# Patient Record
Sex: Male | Born: 1940 | Race: White | Hispanic: No | Marital: Married | Smoking: Former smoker
Health system: Southern US, Community
[De-identification: ages and names within clinical notes are randomized; demographics above are authoritative.]

## PROBLEM LIST (undated history)

## (undated) DIAGNOSIS — M539 Dorsopathy, unspecified: Secondary | ICD-10-CM

## (undated) DIAGNOSIS — M489 Spondylopathy, unspecified: Secondary | ICD-10-CM

## (undated) DIAGNOSIS — I219 Acute myocardial infarction, unspecified: Secondary | ICD-10-CM

## (undated) DIAGNOSIS — J449 Chronic obstructive pulmonary disease, unspecified: Secondary | ICD-10-CM

## (undated) HISTORY — PX: BACK SURGERY: SHX140

---

## 1999-12-30 ENCOUNTER — Ambulatory Visit (HOSPITAL_COMMUNITY): Admission: RE | Admit: 1999-12-30 | Discharge: 1999-12-30 | Payer: Self-pay | Admitting: *Deleted

## 1999-12-30 ENCOUNTER — Encounter: Payer: Self-pay | Admitting: Rheumatology

## 2002-11-16 ENCOUNTER — Encounter: Admission: RE | Admit: 2002-11-16 | Discharge: 2003-02-14 | Payer: Self-pay | Admitting: Cardiology

## 2004-09-03 ENCOUNTER — Ambulatory Visit: Payer: Self-pay | Admitting: Cardiology

## 2004-09-06 ENCOUNTER — Ambulatory Visit: Payer: Self-pay | Admitting: Cardiology

## 2005-06-25 ENCOUNTER — Emergency Department (HOSPITAL_COMMUNITY): Admission: EM | Admit: 2005-06-25 | Discharge: 2005-06-25 | Payer: Self-pay | Admitting: Emergency Medicine

## 2005-09-12 ENCOUNTER — Ambulatory Visit: Payer: Self-pay | Admitting: Cardiology

## 2005-09-20 ENCOUNTER — Ambulatory Visit: Payer: Self-pay | Admitting: Cardiology

## 2005-10-26 ENCOUNTER — Inpatient Hospital Stay (HOSPITAL_COMMUNITY): Admission: EM | Admit: 2005-10-26 | Discharge: 2005-10-30 | Payer: Self-pay | Admitting: Emergency Medicine

## 2005-12-14 ENCOUNTER — Emergency Department (HOSPITAL_COMMUNITY): Admission: EM | Admit: 2005-12-14 | Discharge: 2005-12-14 | Payer: Self-pay | Admitting: Emergency Medicine

## 2007-03-26 ENCOUNTER — Ambulatory Visit: Payer: Self-pay | Admitting: Cardiology

## 2007-03-26 LAB — CONVERTED CEMR LAB
Albumin: 3.9 g/dL (ref 3.5–5.2)
BUN: 10 mg/dL (ref 6–23)
Calcium: 9.6 mg/dL (ref 8.4–10.5)
Chloride: 101 meq/L (ref 96–112)
Potassium: 4.1 meq/L (ref 3.5–5.1)
Sodium: 143 meq/L (ref 135–145)
Total Bilirubin: 0.6 mg/dL (ref 0.3–1.2)
VLDL: 61 mg/dL — ABNORMAL HIGH (ref 0–40)

## 2007-06-05 IMAGING — CT CT HEAD W/O CM
1 series · 16 of 30 positions shown, 20 images · IV contrast (agent unspecified)
Comparison: None

CLINICAL DATA: Dizziness and vomiting.
TECHNIQUE: 5mm collimated images were obtained from the base of the skull
through the vertex according to standard protocol without contrast.

HEAD CT WITHOUT CONTRAST:

[Series 7949: — · axial · 0.49mm/px · z∈[-607,-467]mm · 16 of 32 slices shown, 20 images]
[im 2/32  brain]
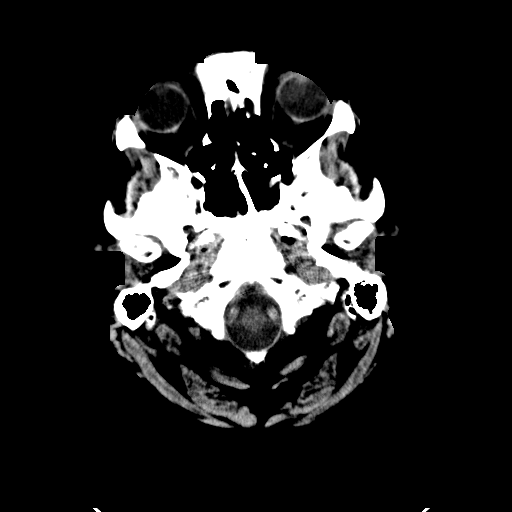
[im 2/32  bone]
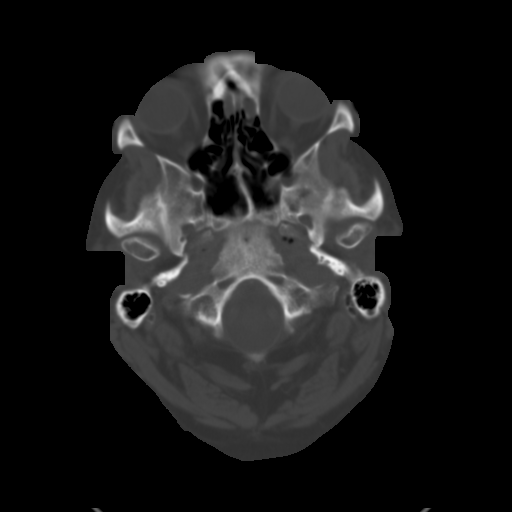
[im 4/32  brain]
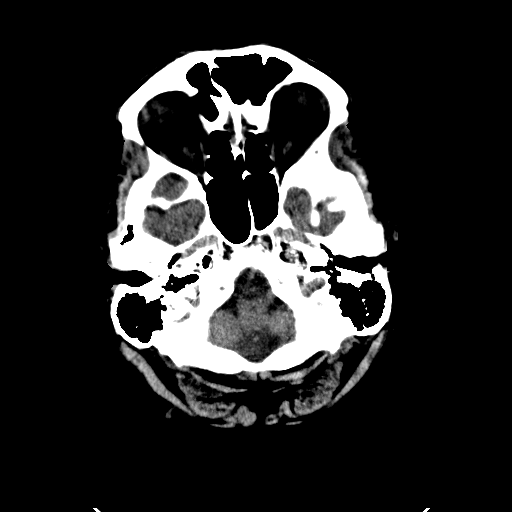
[im 6/32  brain]
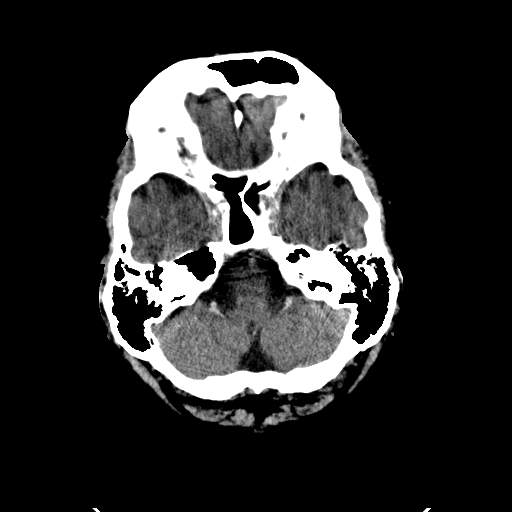
[im 8/32  brain]
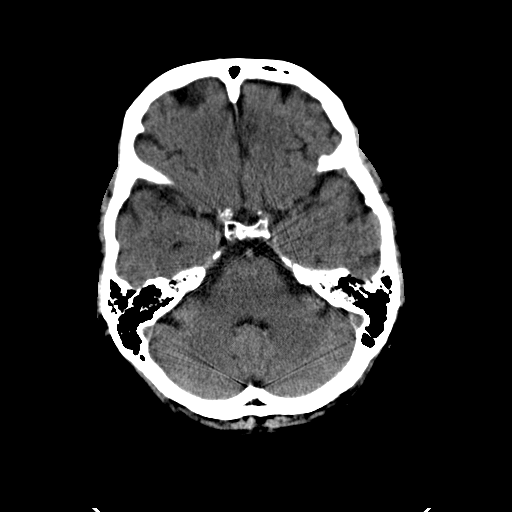
[im 9/32  brain]
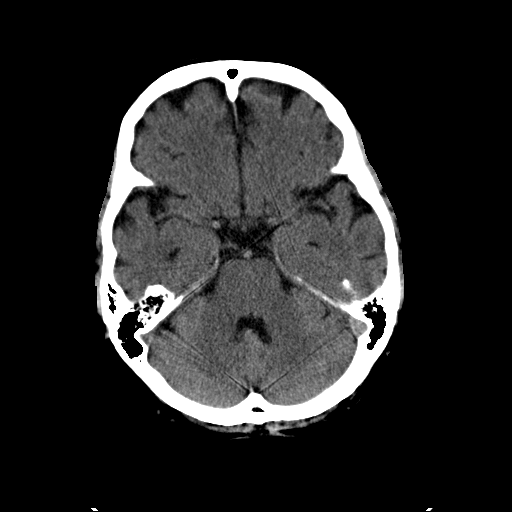
[im 9/32  bone]
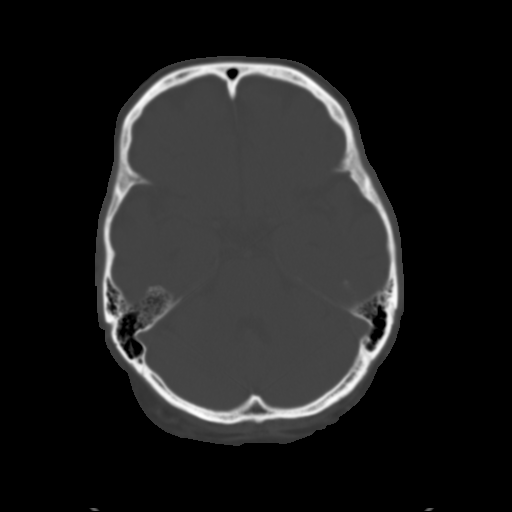
[im 11/32  brain]
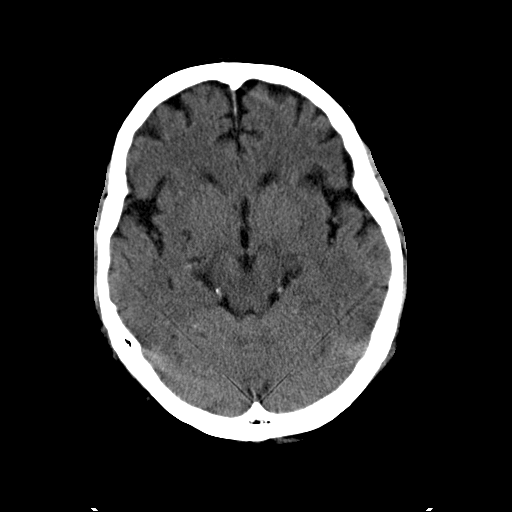
[im 13/32  brain]
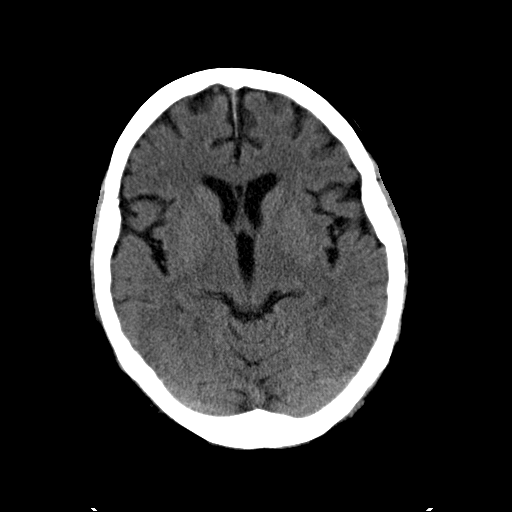
[im 15/32  brain]
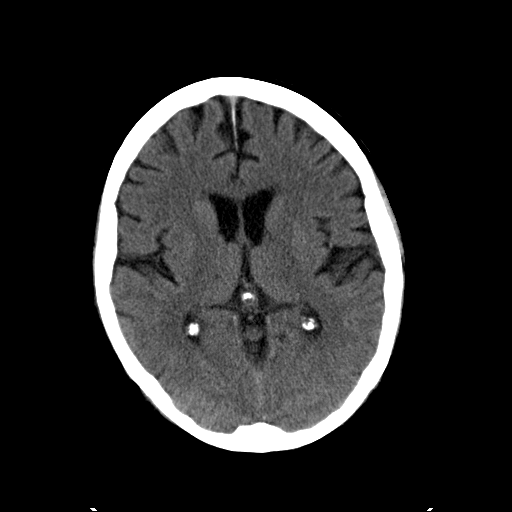
[im 17/32  brain]
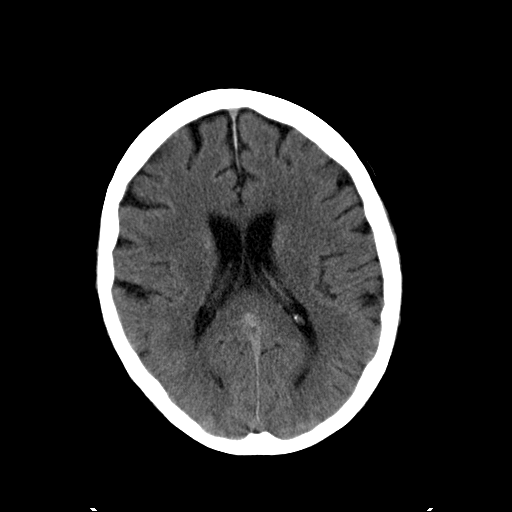
[im 17/32  bone]
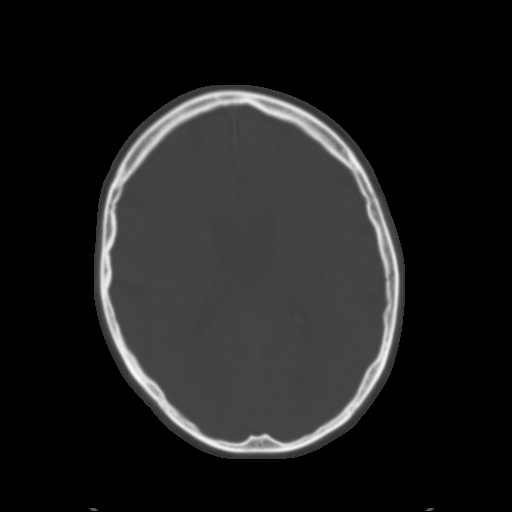
[im 19/32  brain]
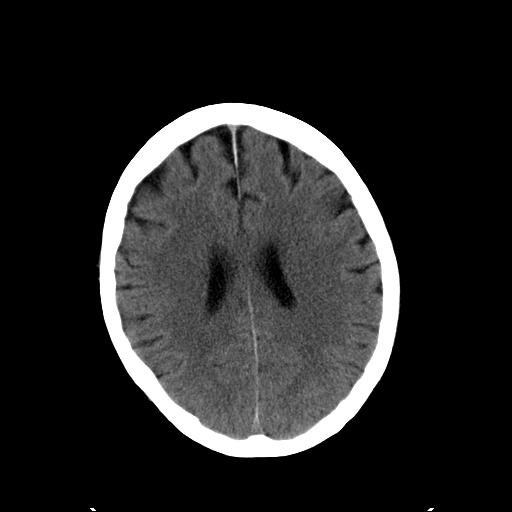
[im 21/32  brain]
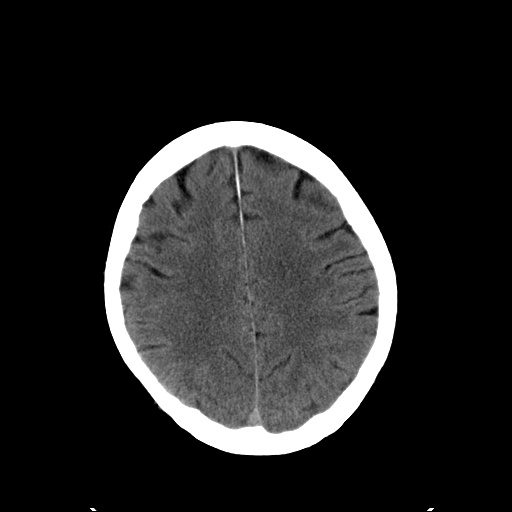
[im 23/32  brain]
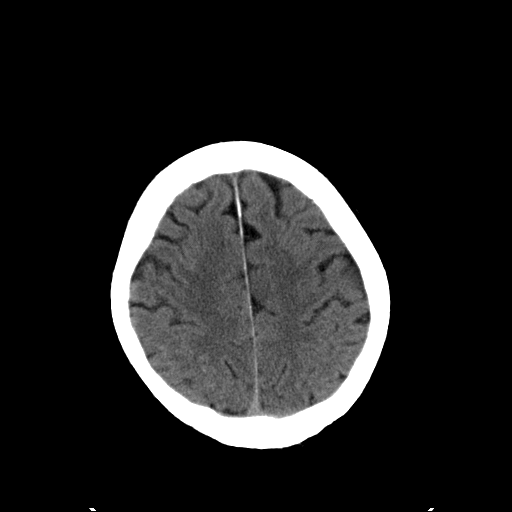
[im 24/32  brain]
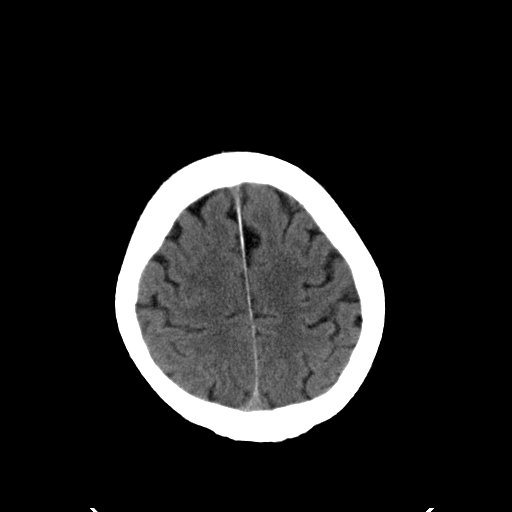
[im 24/32  bone]
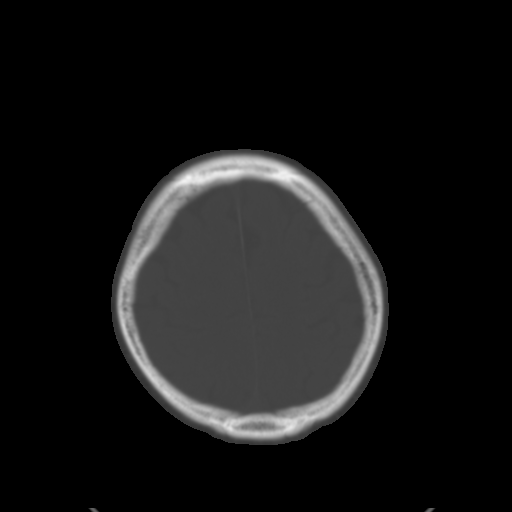
[im 26/32  brain]
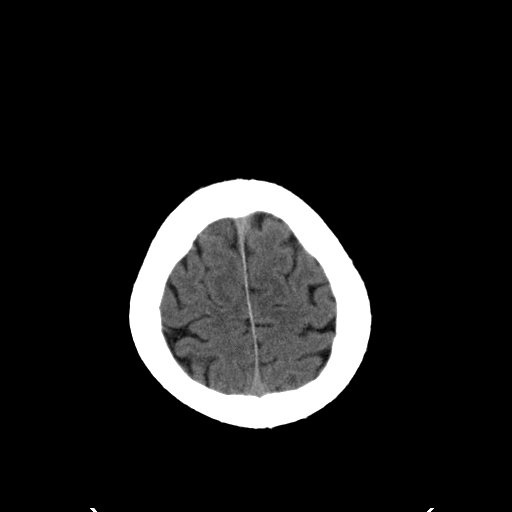
[im 28/32  brain]
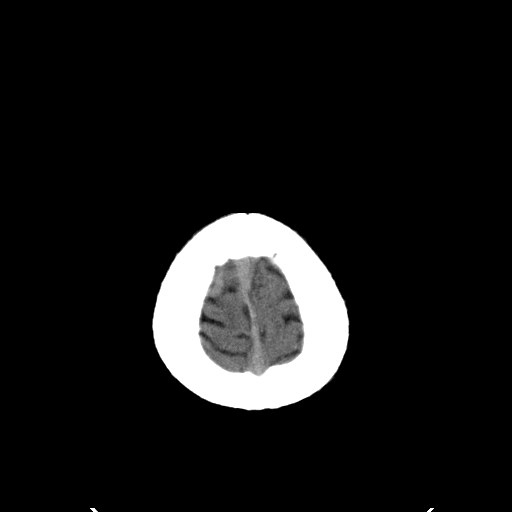
[im 30/32  brain]
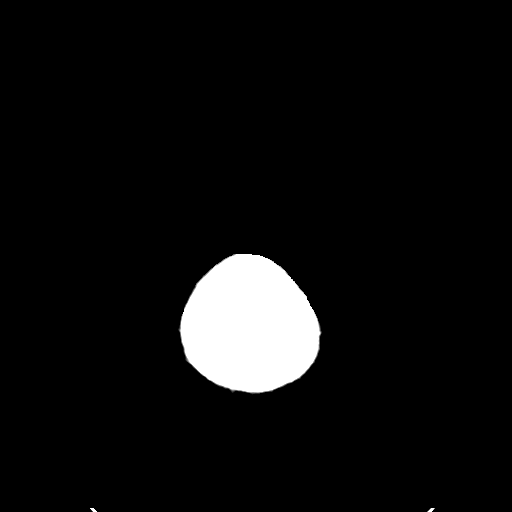

[16 of 30 positions shown; findings below may reference images not displayed]

FINDINGS: There is no evidence for acute hemorrhage, hydrocephalus, mass-effect
or abnormal extra-axial fluid collection.  No definite CT evidence for acute
ischemia. Patchy low attenuation in the deep hemispheric white matter is
nonspecific, but likely related to chronic small vessel disease. Lacunar
infarction noted in the right basal ganglia and right [place]. Mucosal
thickening is noted in the paranasal sinuses.
IMPRESSION: No acute intracranial abnormality. 

Areas of acute ischemia may not be apparent for up to 48 hours on CT head.

## 2008-03-11 ENCOUNTER — Ambulatory Visit: Payer: Self-pay | Admitting: Cardiology

## 2008-11-07 DIAGNOSIS — I219 Acute myocardial infarction, unspecified: Secondary | ICD-10-CM | POA: Insufficient documentation

## 2008-11-07 DIAGNOSIS — J449 Chronic obstructive pulmonary disease, unspecified: Secondary | ICD-10-CM

## 2008-11-07 DIAGNOSIS — E669 Obesity, unspecified: Secondary | ICD-10-CM

## 2008-11-07 DIAGNOSIS — E785 Hyperlipidemia, unspecified: Secondary | ICD-10-CM | POA: Insufficient documentation

## 2008-11-07 DIAGNOSIS — E119 Type 2 diabetes mellitus without complications: Secondary | ICD-10-CM

## 2008-11-07 DIAGNOSIS — R0902 Hypoxemia: Secondary | ICD-10-CM | POA: Insufficient documentation

## 2008-11-07 DIAGNOSIS — J4489 Other specified chronic obstructive pulmonary disease: Secondary | ICD-10-CM | POA: Insufficient documentation

## 2008-11-07 DIAGNOSIS — I10 Essential (primary) hypertension: Secondary | ICD-10-CM | POA: Insufficient documentation

## 2008-11-07 DIAGNOSIS — G4730 Sleep apnea, unspecified: Secondary | ICD-10-CM | POA: Insufficient documentation

## 2008-11-07 DIAGNOSIS — I2510 Atherosclerotic heart disease of native coronary artery without angina pectoris: Secondary | ICD-10-CM

## 2008-11-10 ENCOUNTER — Ambulatory Visit: Payer: Self-pay | Admitting: Cardiology

## 2008-11-14 ENCOUNTER — Telehealth: Payer: Self-pay | Admitting: Cardiology

## 2008-11-29 LAB — CONVERTED CEMR LAB
AST: 20 units/L (ref 0–37)
Albumin: 4.3 g/dL (ref 3.5–5.2)
BUN: 11 mg/dL (ref 6–23)
Basophils Absolute: 0 10*3/uL (ref 0.0–0.1)
Basophils Relative: 0.3 % (ref 0.0–3.0)
Bilirubin, Direct: 0.1 mg/dL (ref 0.0–0.3)
Eosinophils Absolute: 0.6 10*3/uL (ref 0.0–0.7)
GFR calc non Af Amer: 88.79 mL/min (ref >60)
Hemoglobin: 16.6 g/dL (ref 13.0–17.0)
MCHC: 34.2 g/dL (ref 30.0–36.0)
MCV: 94 fL (ref 78.0–100.0)
Monocytes Relative: 5.6 % (ref 3.0–12.0)
Neutrophils Relative %: 68.9 % (ref 43.0–77.0)
Platelets: 204 10*3/uL (ref 150.0–400.0)
RBC: 5.16 M/uL (ref 4.22–5.81)
RDW: 13.5 % (ref 11.5–14.6)
Sodium: 143 meq/L (ref 135–145)
Total Bilirubin: 0.7 mg/dL (ref 0.3–1.2)
Total CHOL/HDL Ratio: 4
WBC: 9.8 10*3/uL (ref 4.5–10.5)

## 2009-03-27 ENCOUNTER — Telehealth: Payer: Self-pay | Admitting: Cardiology

## 2009-06-20 ENCOUNTER — Encounter: Admission: RE | Admit: 2009-06-20 | Discharge: 2009-06-20 | Payer: Self-pay | Admitting: Rheumatology

## 2009-10-27 ENCOUNTER — Ambulatory Visit: Payer: Self-pay | Admitting: Cardiology

## 2009-10-31 LAB — CONVERTED CEMR LAB
ALT: 17 units/L (ref 0–53)
AST: 17 units/L (ref 0–37)
Albumin: 4.3 g/dL (ref 3.5–5.2)
Alkaline Phosphatase: 53 units/L (ref 39–117)
Bilirubin, Direct: 0.1 mg/dL (ref 0.0–0.3)
Creatinine, Ser: 0.7 mg/dL (ref 0.4–1.5)
GFR calc non Af Amer: 110.98 mL/min (ref >60)
Glucose, Bld: 81 mg/dL (ref 70–99)
Total Protein: 6.9 g/dL (ref 6.0–8.3)
VLDL: 71.6 mg/dL — ABNORMAL HIGH (ref 0.0–40.0)

## 2010-06-26 NOTE — Assessment & Plan Note (Signed)
Summary: [name]   Visit Type:  1 yr f/u Primary Provider:  Dr. Megan Mans  CC:  chest pain...sob....edema/ankles.  History of Present Illness: [name] returns today for evaluation and management of his multiple cardiac and other medical issues. Please see assessment and plan.  He is pretty much confined to wheelchair and 2 crutches with his bad orthopedic issues. He denies any angina or ischemic symptoms. He's had no palpitations or syncope. He does have dyspnea on exertion which is pretty much stable.  He still smokes. He does not check his blood sugars. Fortunately, his hemoglobin A1c was only 6.9% last year. He refuses to go to primary care so I manage his diabetes.    Current Medications (verified): 1)  Simvastatin 40 Mg Tabs (Simvastatin) .Marland Kitchen.. 1 Tab Once Daily 2)  Sertraline Hcl 100 Mg Tabs (Sertraline Hcl) .Marland Kitchen.. 1 Tab Once Daily 3)  Benazepril Hcl 10 Mg Tabs (Benazepril Hcl) .Marland Kitchen.. 1 Tab Once Daily 4)  Gabapentin 100 Mg Caps (Gabapentin) .Marland Kitchen.. 1 Tab Two Times A Day 5)  Alprazolam 0.5 Mg Tabs (Alprazolam) .Marland Kitchen.. 1 Tab Two Times A Day 6)  Metformin Hcl 1000 Mg Tabs (Metformin Hcl) .Marland Kitchen.. 1 Tab Two Times A Day 7)  Oxycodone Hcl 5 Mg Tabs (Oxycodone Hcl) .Marland Kitchen.. 1-3 Tabs Daily 8)  Tramadol Hcl 50 Mg Tabs (Tramadol Hcl) .... 2 Tabs Two Times A Day 9)  Metoprolol Tartrate 25 Mg Tabs (Metoprolol Tartrate) .Marland Kitchen.. 1 Tab Two Times A Day 10)  Niaspan 500 Mg Cr-Tabs (Niacin (Antihyperlipidemic)) .... Take 2 Tablet By Mouth At Bedtime  Allergies: 1)  ! Codeine  Past History:  Past Medical History: Last updated: 11/07/2008 MI (ICD-410.90) CAD, UNSPECIFIED SITE (ICD-414.00) HYPERTENSION, UNSPECIFIED (ICD-401.9) HYPERLIPIDEMIA-MIXED (ICD-272.4) COPD (ICD-496) DIABETES MELLITUS, TYPE II (ICD-250.00) OBESITY (ICD-278.00) HYPOXEMIA (ICD-799.02) SLEEP APNEA (ICD-780.57)  Past Surgical History: Last updated: 11/07/2008 11 years ago when he had an angioplasty, but at that time a stent was not  placed.  Family History: Last updated: 11/07/2008  Positive for COPD  Social History: Last updated: 11/07/2008 Disabled  Tobacco Use - Yes.   Risk Factors: Smoking Status: current (11/07/2008)  Review of Systems       negative other than history of present illness  Vital Signs:  Patient profile:   70 year old male Height:      70 inches Weight:      205 pounds BMI:     29.52 Pulse rate:   75 / minute Pulse rhythm:   irregular BP sitting:   124 / 70  (left arm) Cuff size:   large  Vitals Entered By: Danielle Rankin, CMA (October 27, 2009 11:46 AM)  Physical Exam  General:  chronically ill, older appearing than stated age., overweight, muscular Head:  normocephalic and atraumatic Eyes:  PERRLA/EOM intact; conjunctiva and lids normal. Neck:  Neck supple, no JVD. No masses, thyromegaly or abnormal cervical nodes. Chest [place]:  no deformities or breast masses noted Lungs:  decreased breath sounds throughout Heart:  difficult to appreciate PMI, soft S1-S2 splits Msk:  decreased ROM.  in wheelchairdecreased ROM.   Pulses:  pulses normal in all 4 extremities Extremities:  ddent rubor, decreased capillary reflux Neurologic:  Alert and oriented x 3. Skin:  Intact without lesions or rashes. Psych:  Normal affect.   EKG  Procedure date:  10/27/2009  Findings:      normal sinus rhythm, right axis deviation, anterior [place] infarct pattern, no change  Impression & Recommendations:  Problem # 1:  CAD, UNSPECIFIED SITE (ICD-414.00) Assessment Unchanged I feel he is stable. Continue medical therapy. His updated medication list for this problem includes:    Benazepril Hcl 10 Mg Tabs (Benazepril hcl) .Marland Kitchen... 1 tab once daily    Metoprolol Tartrate 25 Mg Tabs (Metoprolol tartrate) .Marland Kitchen... 1 tab two times a day  His updated medication list for this problem includes:    Benazepril Hcl 10 Mg Tabs (Benazepril hcl) .Marland Kitchen... 1 tab once daily    Metoprolol Tartrate 25 Mg Tabs (Metoprolol  tartrate) .Marland Kitchen... 1 tab two times a day  Orders: EKG w/ Interpretation (93000)  Problem # 2:  HYPERTENSION, UNSPECIFIED (ICD-401.9) Assessment: Improved  His updated medication list for this problem includes:    Benazepril Hcl 10 Mg Tabs (Benazepril hcl) .Marland Kitchen... 1 tab once daily    Metoprolol Tartrate 25 Mg Tabs (Metoprolol tartrate) .Marland Kitchen... 1 tab two times a day  His updated medication list for this problem includes:    Benazepril Hcl 10 Mg Tabs (Benazepril hcl) .Marland Kitchen... 1 tab once daily    Metoprolol Tartrate 25 Mg Tabs (Metoprolol tartrate) .Marland Kitchen... 1 tab two times a day  Problem # 3:  HYPERLIPIDEMIA-MIXED (ICD-272.4) I will check lipids today as well as LFTs. No change in treatment. The following medications were removed from the medication list:    Zocor 20 Mg Tabs (Simvastatin) .Marland Kitchen... Take 1 tablet by mouth at bedtime His updated medication list for this problem includes:    Simvastatin 40 Mg Tabs (Simvastatin) .Marland Kitchen... 1 tab once daily    Niaspan 500 Mg Cr-tabs (Niacin (antihyperlipidemic)) .Marland Kitchen... Take 2 tablet by mouth at bedtime  The following medications were removed from the medication list:    Zocor 20 Mg Tabs (Simvastatin) .Marland Kitchen... Take 1 tablet by mouth at bedtime His updated medication list for this problem includes:    Simvastatin 40 Mg Tabs (Simvastatin) .Marland Kitchen... 1 tab once daily    Niaspan 500 Mg Cr-tabs (Niacin (antihyperlipidemic)) .Marland Kitchen... Take 2 tablet by mouth at bedtime  Orders: TLB-Lipid Panel (80061-LIPID)  Problem # 4:  COPD (ICD-496) Advised to quit smoking.  Problem # 5:  MI (ICD-410.90) Assessment: Unchanged  His updated medication list for this problem includes:    Benazepril Hcl 10 Mg Tabs (Benazepril hcl) .Marland Kitchen... 1 tab once daily    Metoprolol Tartrate 25 Mg Tabs (Metoprolol tartrate) .Marland Kitchen... 1 tab two times a day  His updated medication list for this problem includes:    Benazepril Hcl 10 Mg Tabs (Benazepril hcl) .Marland Kitchen... 1 tab once daily    Metoprolol Tartrate 25 Mg  Tabs (Metoprolol tartrate) .Marland Kitchen... 1 tab two times a day  Problem # 6:  DIABETES MELLITUS, TYPE II (ICD-250.00) I have advised him to check his blood sugar more frequently at time. Check hemoglobin A1c today. His updated medication list for this problem includes:    Benazepril Hcl 10 Mg Tabs (Benazepril hcl) .Marland Kitchen... 1 tab once daily    Metformin Hcl 1000 Mg Tabs (Metformin hcl) .Marland Kitchen... 1 tab two times a day  His updated medication list for this problem includes:    Benazepril Hcl 10 Mg Tabs (Benazepril hcl) .Marland Kitchen... 1 tab once daily    Metformin Hcl 1000 Mg Tabs (Metformin hcl) .Marland Kitchen... 1 tab two times a day  Orders: TLB-BMP (Basic Metabolic Panel-BMET) (80048-METABOL) TLB-A1C / Hgb A1C (Glycohemoglobin) (83036-A1C)  Problem # 7:  SLEEP APNEA (ICD-780.57) Assessment: Unchanged  Other Orders: TLB-Hepatic/Liver Function Pnl (80076-HEPATIC)  Patient Instructions: 1)  Your physician recommends that you schedule a follow-up  appointment in: YEAR WITH DR Braniya Farrugia 2)  Your physician recommends that you continue on your current medications as directed. Please refer to the Current Medication list given to you today. 3)  Your physician recommends that you return for lab work ZO:XWRUE BMET LIPID LIVER A1C

## 2010-07-24 ENCOUNTER — Encounter: Payer: Self-pay | Admitting: Cardiology

## 2010-07-24 ENCOUNTER — Ambulatory Visit (INDEPENDENT_AMBULATORY_CARE_PROVIDER_SITE_OTHER): Payer: Medicare Other | Admitting: Cardiology

## 2010-07-24 ENCOUNTER — Other Ambulatory Visit: Payer: Self-pay | Admitting: Cardiology

## 2010-07-24 DIAGNOSIS — F17200 Nicotine dependence, unspecified, uncomplicated: Secondary | ICD-10-CM | POA: Insufficient documentation

## 2010-07-24 DIAGNOSIS — E785 Hyperlipidemia, unspecified: Secondary | ICD-10-CM

## 2010-07-24 DIAGNOSIS — I219 Acute myocardial infarction, unspecified: Secondary | ICD-10-CM

## 2010-07-24 DIAGNOSIS — E782 Mixed hyperlipidemia: Secondary | ICD-10-CM

## 2010-07-24 DIAGNOSIS — I10 Essential (primary) hypertension: Secondary | ICD-10-CM

## 2010-07-24 DIAGNOSIS — E119 Type 2 diabetes mellitus without complications: Secondary | ICD-10-CM

## 2010-07-24 DIAGNOSIS — I2510 Atherosclerotic heart disease of native coronary artery without angina pectoris: Secondary | ICD-10-CM

## 2010-07-24 LAB — BASIC METABOLIC PANEL
Calcium: 8.7 mg/dL (ref 8.4–10.5)
Chloride: 98 mEq/L (ref 96–112)
GFR: 135.83 mL/min (ref >60.00)
Glucose, Bld: 114 mg/dL — ABNORMAL HIGH (ref 70–99)
Potassium: 4.7 mEq/L (ref 3.5–5.1)
Sodium: 139 mEq/L (ref 135–145)

## 2010-07-24 LAB — HEMOGLOBIN A1C: Hgb A1c MFr Bld: 7.2 % — ABNORMAL HIGH (ref 4.6–6.5)

## 2010-07-24 LAB — HEPATIC FUNCTION PANEL
ALT: 15 U/L (ref 0–53)
Albumin: 3.9 g/dL (ref 3.5–5.2)
Bilirubin, Direct: 0.1 mg/dL (ref 0.0–0.3)
Total Bilirubin: 0.5 mg/dL (ref 0.3–1.2)

## 2010-07-24 LAB — LIPID PANEL
Cholesterol: 97 mg/dL (ref 0–200)
Total CHOL/HDL Ratio: 4
VLDL: 58.2 mg/dL — ABNORMAL HIGH (ref 0.0–40.0)

## 2010-08-02 NOTE — Assessment & Plan Note (Signed)
Summary: per check out/sf per pt -mj/ep-   Visit Type:  6 mo follow up Primary Provider:  Dr. Megan Mans  CC:  edema at times. pt has no other complaints today. pt did not have med list today....verified verbally.Marland Kitchen  History of Present Illness: Mr Gutridge returns for E and M of his CAD, HTN, DM, and hyperlipidemia. He is confined to wheelchair. Still smokes a pack a day. Wants bloodworlk today. Refuses to go to primary care. No cardiac complaints.  Current Medications (verified): 1)  Simvastatin 40 Mg Tabs (Simvastatin) .Marland Kitchen.. 1 Tab Once Daily 2)  Sertraline Hcl 100 Mg Tabs (Sertraline Hcl) .Marland Kitchen.. 1 Tab Once Daily 3)  Benazepril Hcl 10 Mg Tabs (Benazepril Hcl) .Marland Kitchen.. 1 Tab Once Daily 4)  Alprazolam 0.5 Mg Tabs (Alprazolam) .Marland Kitchen.. 1 Tab Two Times A Day 5)  Metformin Hcl 1000 Mg Tabs (Metformin Hcl) .Marland Kitchen.. 1 Tab Two Times A Day 6)  Oxycodone-Acetaminophen 7.5-500 Mg Tabs (Oxycodone-Acetaminophen) .Marland Kitchen.. 1-3 Tabs Daily or As Needed 7)  Tramadol Hcl 50 Mg Tabs (Tramadol Hcl) .... 2 Tabs Two Times A Day 8)  Metoprolol Tartrate 25 Mg Tabs (Metoprolol Tartrate) .Marland Kitchen.. 1 Tab Two Times A Day 9)  Niaspan 500 Mg Cr-Tabs (Niacin (Antihyperlipidemic)) .... Take 2 Tablet By Mouth At Bedtime  Allergies (verified): 1)  ! Codeine  Past History:  Past Medical History: Last updated: 11/07/2008 MI (ICD-410.90) CAD, UNSPECIFIED SITE (ICD-414.00) HYPERTENSION, UNSPECIFIED (ICD-401.9) HYPERLIPIDEMIA-MIXED (ICD-272.4) COPD (ICD-496) DIABETES MELLITUS, TYPE II (ICD-250.00) OBESITY (ICD-278.00) HYPOXEMIA (ICD-799.02) SLEEP APNEA (ICD-780.57)  Past Surgical History: Last updated: 11/07/2008 11 years ago when he had an angioplasty, but at that time a stent was not placed.  Family History: Last updated: 11/07/2008  Positive for COPD  Social History: Last updated: 11/07/2008 Disabled  Tobacco Use - Yes.   Risk Factors: Smoking Status: current (11/07/2008)  Review of Systems       negative other than  HPI  Vital Signs:  Patient profile:   70 year old male Height:      70 inches Weight:      205 pounds BMI:     29.52 Pulse rate:   76 / minute Resp:     18 per minute BP sitting:   112 / 72  (left arm) Cuff size:   large  Vitals Entered By: Celestia Khat, CMA (July 24, 2010 10:42 AM)  Physical Exam  General:  Chronically ill, NAD Head:  normocephalic and atraumatic Eyes:  PERRLA/EOM intact; conjunctiva and lids normal. Neck:  Neck supple, no JVD. No masses, thyromegaly or abnormal cervical nodes. Lungs:  DECREASES BS THROUGHOUT Heart:  RRR, SOFT S1 AND S2, NO BRUITS Msk:  decreased ROM.  IN WHEELCHAIR Pulses:  REDUCED BUT PRESENT IN LE Extremities:  trace left pedal edema and trace right pedal edema. DEPENDENT RUBOR, REDUCED CAPILLARY REFILL  Neurologic:  Alert and oriented x 3. Skin:  Intact without lesions or rashes. Psych:  Normal affect.   EKG  Procedure date:  07/24/2010  Findings:      NSR, OLD IMI, NO CHANGES  Impression & Recommendations:  Problem # 1:  CAD, UNSPECIFIED SITE (ICD-414.00) Assessment Unchanged  His updated medication list for this problem includes:    Benazepril Hcl 10 Mg Tabs (Benazepril hcl) .Marland Kitchen... 1 tab once daily    Metoprolol Tartrate 25 Mg Tabs (Metoprolol tartrate) .Marland Kitchen... 1 tab two times a day  Orders: EKG w/ Interpretation (93000) TLB-BMP (Basic Metabolic Panel-BMET) (80048-METABOL) TLB-Hepatic/Liver Function Pnl (80076-HEPATIC) TLB-Lipid Panel (80061-LIPID)  Problem #  2:  MI (ICD-410.90) Assessment: Unchanged  His updated medication list for this problem includes:    Benazepril Hcl 10 Mg Tabs (Benazepril hcl) .Marland Kitchen... 1 tab once daily    Metoprolol Tartrate 25 Mg Tabs (Metoprolol tartrate) .Marland Kitchen... 1 tab two times a day  Orders: EKG w/ Interpretation (93000) TLB-BMP (Basic Metabolic Panel-BMET) (80048-METABOL) TLB-Hepatic/Liver Function Pnl (80076-HEPATIC) TLB-Lipid Panel (80061-LIPID)  Problem # 3:  HYPERTENSION,  UNSPECIFIED (ICD-401.9) Assessment: Improved  His updated medication list for this problem includes:    Benazepril Hcl 10 Mg Tabs (Benazepril hcl) .Marland Kitchen... 1 tab once daily    Metoprolol Tartrate 25 Mg Tabs (Metoprolol tartrate) .Marland Kitchen... 1 tab two times a day  Orders: TLB-BMP (Basic Metabolic Panel-BMET) (80048-METABOL) TLB-Hepatic/Liver Function Pnl (80076-HEPATIC) TLB-Lipid Panel (80061-LIPID)  Problem # 4:  HYPERLIPIDEMIA-MIXED (ICD-272.4)  His updated medication list for this problem includes:    Simvastatin 40 Mg Tabs (Simvastatin) .Marland Kitchen... 1 tab once daily    Niaspan 500 Mg Cr-tabs (Niacin (antihyperlipidemic)) .Marland Kitchen... Take 2 tablet by mouth at bedtime  Orders: TLB-Hepatic/Liver Function Pnl (80076-HEPATIC) TLB-Lipid Panel (80061-LIPID)  Problem # 5:  COPD (ICD-496) Assessment: Deteriorated  Problem # 6:  DIABETES MELLITUS, TYPE II (ICD-250.00) Assessment: Unchanged  His updated medication list for this problem includes:    Benazepril Hcl 10 Mg Tabs (Benazepril hcl) .Marland Kitchen... 1 tab once daily    Metformin Hcl 1000 Mg Tabs (Metformin hcl) .Marland Kitchen... 1 tab two times a day  Orders: TLB-A1C / Hgb A1C (Glycohemoglobin) (83036-A1C)  Problem # 7:  TOBACCO ABUSE (ICD-305.1) Assessment: Unchanged aDVISED TO QUIT.  Patient Instructions: 1)  Your physician recommends that you schedule a follow-up appointment in: 1year with Dr. Daleen Squibb 2)  Your physician recommends that you  have a FASTING lipid profile today  3)  Your physician recommends that you continue on your current medications as directed. Please refer to the Current Medication list given to you today.  Appended Document: per check out/sf per pt -mj/ep-    Clinical Lists Changes  Medications: Rx of BENAZEPRIL HCL 10 MG TABS (BENAZEPRIL HCL) 1 tab once daily;  #90 x 3;  Signed;  Entered by: Lisabeth Devoid RN;  Authorized by: Gaylord Shih, MD, [facility];  Method used: Electronically to [facility] [address] [place], [place], [place]  [address], Ph: [phone], Fax: [phone] Rx of METOPROLOL TARTRATE 25 MG TABS (METOPROLOL TARTRATE) 1 tab two times a day;  #180 x 3;  Signed;  Entered by: Lisabeth Devoid RN;  Authorized by: Gaylord Shih, MD, [facility];  Method used: Electronically to [facility] [address] [place], [place], [place]  [address], Ph: [phone], Fax: [phone]    Prescriptions: METOPROLOL TARTRATE 25 MG TABS (METOPROLOL TARTRATE) 1 tab two times a day  #180 x 3   Entered by:   Lisabeth Devoid RN   Authorized by:   Gaylord Shih, MD, [facility]   Signed by:   Lisabeth Devoid RN on 07/24/2010   Method used:   Electronically to        [facility] (retail)       [address]       [place], [place]  [address]       Ph: [phone]       Fax: [phone]   RxID:   [id number] BENAZEPRIL HCL 10 MG TABS (BENAZEPRIL HCL) 1 tab once daily  #90 x 3   Entered by:   Lisabeth Devoid RN  Authorized by:   Gaylord Shih, MD, [facility]   Signed by:   Lisabeth Devoid RN on 07/24/2010   Method used:   Electronically to        [facility] (retail)       [address]       [place], [place]  [address]       Ph: [phone]       Fax: [phone]   RxID:   [id number]

## 2010-08-06 ENCOUNTER — Telehealth: Payer: Self-pay | Admitting: Cardiology

## 2010-08-14 NOTE — Progress Notes (Signed)
Summary: test result  Phone Note Call from Patient Call back at Home Phone [phone]   Caller: Spouse- cheryl  Reason for Call: Talk to Nurse, Lab or Test Results Initial call taken by: Lorne Skeens,  August 06, 2010 11:29 AM  Follow-up for Phone Call        pt wife given lab results Meredith Staggers, RN  August 06, 2010 11:44 AM

## 2010-10-09 NOTE — Assessment & Plan Note (Signed)
[facility]                            CARDIOLOGY OFFICE NOTE   NAME:[name] [name] PANKOW                     MRN:          [id number]  DATE:03/26/2007                            DOB:          Sep 05, 1940    [name] returns today for further management of the following  issues:  1. Coronary artery disease with moderate left ventricular systolic      dysfunction.  He is asymptomatic.  2. Mixed hyperlipidemia.  He cannot remember the last time he had      lipids checked.  He is on Niaspan 1 gm and simvastatin 20 mg a day.  3. Hypertension.  Under good control.  4. Obesity.  5. Type 2 diabetes.  He says he does not check his blood sugar      anymore.   His biggest problem is that he is almost unable to walk.  His back  issues, which have been chronic, are not requiring him to walk with a  crutch.  He does take care of 30 dogs with his wife.   CURRENT MEDICATIONS:  1. Zoloft 100 mg a day.  2. Lotensin 10 mg a day.  3. Valium 5 mg p.o. t.i.d.  4. Zanaflex 4 mg p.o. nightly.  5. Niaspan 1 gm nightly.  6. Zocor 20 mg nightly.  7. TENS unit.  8. Aspirin 325 mg daily.  9. Metoprolol 25 mg p.o. b.i.d.  10.Actos and Metformin 15/850 mg b.i.d.   PHYSICAL EXAMINATION:  Blood pressure 122/74, pulse 63 and regular.  Weight is 217, which is up 11.  HEENT:  He has a thick beard, which is baseline.  PERRLA.  Extraocular  movements are intact.  Sclerae are clear.  NECK:  Carotid upstrokes are equal bilaterally without bruits.  No JVD.  Thyroid is not enlarged.  Trachea is midline.  LUNGS:  Clear anteriorly.  He really cannot move off the table.  His PMI  is poorly appreciated.  He has a big, barrel, muscular chest.  HEART:  Regular rate and rhythm.  Soft S1 and S2.  ABDOMEN:  Soft with good bowel sounds.  No midline bruit.  No  hepatomegaly.  EXTREMITIES:  No edema.  Pulses were +2/4.  NEUROLOGIC:  A lot of scaling, particularly in the lower extremities.  Grossly intact except for his back.   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  There has been no change.   ASSESSMENT/PLAN:  Mr. [name] [name] is very limited with his orthopedic  issues.  He is asymptomatic.  He is on a good medical program.  I would  like to check lipids, LFTs, and a chem-7 today.  He will follow along  with Dr. Corliss [name] concerning his blood sugar.  I have encouraged him to  at least check it.  I will see him back again in a year.     Thomas C. Daleen Squibb, MD, [facility]  Electronically Signed    TCW/MedQ  DD: 03/26/2007  DT: 03/26/2007  Job #: [id number]   cc:   Pollyann Savoy, M.D.

## 2010-10-09 NOTE — Assessment & Plan Note (Signed)
[facility]                            CARDIOLOGY OFFICE NOTE   NAME:[name], [name]                     MRN:          [id number]  DATE:03/11/2008                            DOB:          [birth year]    1. [name] comes in today for further management of his coronary      artery disease.  He is having no ischemic symptoms, though he is      very limited in his ambulation now with his back.  He is walking on      crutches.  2. Mixed hyperlipidemia.  He has not had his lipids checked in a      while.  3. Hypertension under good control.  4. Obesity.  5. Type 2 diabetes.  He has not checked his blood sugars.  He is due      blood work.   He would like something less expensive than Actos and metformin  combined.   CURRENT MEDICATIONS:  1. Simvastatin 40 mg q.h.s.  2. Sertraline 100 mg a day.  3. Benazepril 10 mg a day.  4. Gabapentin 100 mg b.i.d.  5. Alprazolam 0.5 mg b.i.d.  6. ACTOplus/met 15/850 b.i.d.  7. Oxycodone 1-3 daily.  8. Tramadol 100 mg b.i.d.  9. Metoprolol 25 b.i.d.   PHYSICAL EXAMINATION:  VITAL SIGNS:  Blood pressure today is 126/70, his  pulse is 69 and regular, his weight is 219.  HEENT:  Heavily bearded as usual.  Sclerae slightly injected.  Carotid  upstrokes were equal bilaterally without bruits.  No JVD.  NECK:  Stiff from his arthritis.  LUNGS:  Clear to auscultation and percussion.  HEART:  Normal S1 and S2.  No gallop, rub, or murmur.  ABDOMINAL:  Soft, good bowel sounds.  No midline bruit.  EXTREMITIES:  No cyanosis, clubbing, or edema.  Pulses are intact.   EKG shows normal sinus rhythm, rightward axis, right atrial enlargement,  small Qs inferiorly.  There has been no change.   [name] is stable from my standpoint.  I wish he would check his  blood sugars, but I have asked him to do this for years and he does not.  We will check his comprehensive metabolic panel today, lipid panel, and  a hemoglobin  A1c.  I have changed his ACTOplus met to just metformin  1000 mg p.o. b.i.d.  This will save him some money.  He has always had  fairly good control of his blood sugar anyway.  This will also not make  him hypoglycemic.   I will plan on seeing him back again in 6 months.     Thomas C. Daleen Squibb, MD, [facility]  Electronically Signed    TCW/MedQ  DD: 03/11/2008  DT: 03/12/2008  Job #: [id number]

## 2010-10-12 NOTE — Group Therapy Note (Signed)
NAMEJOMES, [name]              ACCOUNT NO.:  [id number]   MEDICAL RECORD NO.:  [id number]          PATIENT TYPE:  INP   LOCATION:  A218                          FACILITY:  [facility]   PHYSICIAN:  Edward L. Juanetta Gosling, M.D.DATE OF BIRTH:  1941/03/18   DATE OF PROCEDURE:  10/30/2005  DATE OF DISCHARGE:  10/30/2005                                   PROGRESS NOTE   Patient of Dr. Renard Matter.   [name] is being discharged and I will, of course, sign off at this  point.  Thank you for allowing me to see him with you.      Edward L. Juanetta Gosling, M.D.  Electronically Signed     ELH/MEDQ  D:  10/30/2005  T:  10/30/2005  Job:  [id number]

## 2010-10-12 NOTE — H&P (Signed)
[name], [name]              ACCOUNT NO.:  [id number]   MEDICAL RECORD NO.:  [id number]           PATIENT TYPE:   LOCATION:                                FACILITY:  [facility]   PHYSICIAN:  Mila Homer. Sudie Bailey, M.D.   DATE OF BIRTH:   DATE OF ADMISSION:  10/26/2005  DATE OF DISCHARGE:  LH                                HISTORY & PHYSICAL   HISTORY OF PRESENT ILLNESS:  This 70 year old developed a cough and  shortness of breath and presented to the ER.  His wife had similar symptoms.   He has a long history of cigarette smoking, over 50 years, and being a pack  or more of cigarettes a day.  He has never been admitted to the hospital  with this.   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  Other diagnoses include type 2 diabetes, coronary artery  disease, status post MI, essential hypertension, and obesity.  It was 11  years ago when he had an angioplasty, but at that time a stent was not  placed.   He is followed by Dr. Juanito Doom, his cardiologist.   CURRENT MEDICATIONS:  1.  Tylox 5/500 b.i.d. for pain.  2.  Metoprolol 25 mg daily.  3.  ASA 325 mg daily.  4.  Xanax 0.5 mg t.i.d. p.r.n.  5.  Zocor 40 mg q.h.s.  6.  Tramadol 50 mg daily p.r.n. pain.  7.  Zoloft 100 mg daily.   PHYSICAL EXAMINATION:  GENERAL:  This exam showed a pleasant 70 year old man  who was oriented, alert, and really in no acute distress at the time I saw  him.  He was on O2 by nasal prongs, however.  His color was good.  VITAL SIGNS:  His temperature is 99 degrees, pulse 86, respiratory rate 20,  blood pressure 106/52.  His weight was 207, height 70 inches.  HEART:  His heart had a regular rhythm and rate of about 80, but heart  sounds were distant.  LUNGS:  Lungs were actually clear throughout, but breath sounds were distant  as well.  There were no intercostal retractions, no use of accessory muscles  for respiration.  NODES:  There were negative  anterior cervical nodes, no axillary or  supraclavicular adenopathy.  SKIN:  Skin turgor was normal.  ABDOMEN:  Somewhat distended and full, but nontender.  No organomegaly or  masses appreciated.  EXTREMITIES:  There was no edema of the ankles.   LABORATORY DATA:  Chest x-ray showed COPD and probable bronchitis.   His white cell count was 9900 with a normal differential, H&H 13.6/40.3.  Last glucose was 125.  His D-dimer was 1.08.  Cardiac markers were negative.  His blood gases showed a pH of 7.38, PCO2 49, PO2 49, and O2 saturation 85%.   ADMISSION DIAGNOSES:  1.  Chronic obstructive pulmonary disease exacerbation.  2.  Type 2 diabetes.  3.  Obesity.  4.  Essential hypertension.  5.  Coronary artery disease, status post myocardial infarction and      angioplasty.  6.  Ankylosing spondylitis.   PLAN:  Plan of treatment includes continuing current medications, but also  put him on Solu-Medrol 125 mg IV q.6 h., Levaquin 500 mg p.o. q.24 h.,  Rocephin 1 g IV q.24 h., half norma saline IV 75 ml an hour, O2 2 L by nasal  cannula, 2 g sodium 1800 calorie ADA diet.  He is also on albuterol/Atrovent  nebulizer treatments q.4 h. while awake, and we are doing Accu-Cheks a.c.  and h.s.  Initially the Accu-Cheks were low, but has now gone up to 300, and  he is to be on Solu-Medrol, so he needs sliding scale insulin by standard  procedure.      Mila Homer. Sudie Bailey, M.D.  Electronically Signed     SDK/MEDQ  D:  10/26/2005  T:  10/26/2005  Job:  [id number]

## 2010-10-12 NOTE — Procedures (Signed)
[name], [name]              ACCOUNT NO.:  [id number]   MEDICAL RECORD NO.:  [id number]          PATIENT TYPE:  INP   LOCATION:  A218                          FACILITY:  [facility]   PHYSICIAN:  Edward L. Juanetta Gosling, M.D.DATE OF BIRTH:  Sep 13, 1940   DATE OF PROCEDURE:  10/26/2005  DATE OF DISCHARGE:                                EKG INTERPRETATION   The rhythm is sinus rhythm rate with a rate in the 70s.  There is right axis  deviation.  Q-waves are seen inferiorly which may be due to inferior  infarction or other problem.  There are ST-T wave abnormalities seen  anteriorly and laterally which are nonspecific.  Abnormal electrocardiogram.      Oneal Deputy. Juanetta Gosling, M.D.  Electronically Signed     ELH/MEDQ  D:  10/28/2005  T:  10/29/2005  Job:  [id number]

## 2010-10-12 NOTE — Discharge Summary (Signed)
NAMEPERETZ, THIEME              ACCOUNT NO.:  [id number]   MEDICAL RECORD NO.:  [id number]          PATIENT TYPE:  INP   LOCATION:  A218                          FACILITY:  [facility]   PHYSICIAN:  Angus G. Renard Matter, MD   DATE OF BIRTH:  05-Jul-1940   DATE OF ADMISSION:  10/26/2005  DATE OF DISCHARGE:  06/06/2007LH                                 DISCHARGE SUMMARY   A 70 year old white male admitted October 26, 2005, discharged October 30, 2005,  four days' hospitalization.   DIAGNOSES:  1.  Chronic obstructive pulmonary disease acute exacerbation.  2.  Hypoxemia.  3.  Diabetes mellitus type 2.  4.  Hypertension.  5.  History of coronary artery disease status post myocardial infarction and      angioplasty.  6.  Possible sleep apnea.   CONDITION:  Stable and improved at the time of his discharge.   This 70 year old male had developed cough and shortness of breath.  Had  presented to the ED.  Has longstanding history of cigarette smoking.  Does  have a history of previous MI, essential hypertension, and obesity.  He had  been followed by Dr. Juanito Doom, cardiologist.   PHYSICAL EXAMINATION ON ADMISSION:  VITAL SIGNS:  Blood pressure 106/52,  respirations 20, pulse 86, temperature 99.  HEENT:  Eyes:  PERRLA.  TM negative.  Oropharynx benign.  HEART:  Regular rhythm.  LUNGS:  Clear to P&A.  ABDOMEN:  Slightly distended.  No palpable organs or masses.  EXTREMITIES:  Free of edema.   LABORATORY DATA:  Admission CBC:  WBC 9900 with a hemoglobin of 13.3,  hematocrit 40.3.  ABGs on admission:  pH 7.35 with pCO2 35, pO2 80.  CBC:  WBC 9900 with a hemoglobin 13.6, hematocrit 40.3.  Chemistries:  Sodium 141,  potassium 4.5, chloride 106, CO2 32, glucose 125, BUN 7, creatinine 0.7,  calcium 8.9.  Cardiac markers:  Myoglobin 74.4 with CPK-MB 1.2, troponin  0.05.  X-rays:  CT angio of chest:  No evidence of pulmonary embolization,  atheromatous changes in descending aorta, coronary artery  calcifications,  30.39 mm left upper quadrant soft tissue mass possible adrenal or renal in  origin.  Chest x-ray showed evidence of COPD and evidence of bronchitis.  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  EKG is sinus rhythm with a rate in the 70s.  Q-  waves seen inferiorly which may be due to inferior infarction or other  problem.   HOSPITAL COURSE:  Patient at the time of his admission was started on half  normal saline at 75 mL per hour and nasal O2 at 2 L per minute.  He was  given Solu-Medrol 125 mg IV every six hours, started on Levaquin 500 mg  daily, Rocephin 1 g daily, albuterol/Atrovent nebulizer treatments every  four hours while awake.  Placed on 2 g low sodium, 1800 calorie ADA diet.  Accu-Cheks were monitored a.c. and h.s.  [facility] standing orders were  ordered.  Placed on sliding scale protocol with NovoLog moderate insulin.  He was also given Lovenox 40 mg subcutaneous daily, Tylox b.i.d., metoprolol  25 mg daily, Zocor 40 mg h.s., tramadol 50 mg q.i.d., Zoloft 100 mg daily.  Patient slowly improved during his hospital stay.  It was felt that he in  all likelihood had sleep apnea and arrangements were made for him to have a  sleep study as an outpatient following discharge from the hospital.  Patient  had CT of the abdomen which showed a 3.8 x 3.2 cm fat-containing mass left  adrenal gland compatible with myelolipoma.  Patient was stable and improved  and at the end of four days was able to be discharged.  Patient was able to  be discharged on p.o. Levaquin 500 mg daily, Medrol dose pack 4 mg six-day  therapy, Ventolin HFA two puffs every four hours p.r.n., and  ___codimal_______ DH teaspoon q.4h. p.r.n. for cough, Mucinex two tablets  b.i.d.  He was asked to continue his other medications that he was on prior  to admission Zoloft 100 mg daily, Xanax 0.5 mg t.i.d., metoprolol 25 mg  b.i.d.,  benazepril 10 mg daily, Avandamet b.i.d., Ultram p.r.n.      Angus G. Renard Matter, MD  Electronically Signed     AGM/MEDQ  D:  11/13/2005  T:  11/13/2005  Job:  [id number]

## 2010-10-12 NOTE — Letter (Signed)
December 09, 2007    [facility]  [address]  [place] [phone]  [place], [place] [address]   RE:  [name], [name]  MRN:  [id number]  /  DOB:  1940/09/22   To Whom It May Concern:   I understand [name] is changing insurance.  He has needed a  letter stating that he needs to be on Actoplus Met 15/850 mg b.i.d. for  his diabetes.  I feel this medication is necessary and it is the  appropriate drug with his history of coronary artery disease.    Sincerely,      [name]. Daleen Squibb, MD, [facility]  Electronically Signed    TCW/MedQ  DD: 12/09/2007  DT: 12/10/2007  Job #: [id number]

## 2010-10-12 NOTE — Consult Note (Signed)
[name], [name]              ACCOUNT NO.:  [id number]   MEDICAL RECORD NO.:  [id number]          PATIENT TYPE:  INP   LOCATION:  A218                          FACILITY:  [facility]   PHYSICIAN:  Edward L. Juanetta Gosling, M.D.DATE OF BIRTH:  Jan 26, 1941   DATE OF CONSULTATION:  DATE OF DISCHARGE:                                   CONSULTATION   REASON FOR CONSULTATION:  COPD.   HISTORY:  [name] is a 70 year old who has had a cough and congestion  for about a week and then came to the emergency room because he had  increasing shortness of breath.  He has had multiple episodes of similar  problems, but says that this time he just could not get over it.  He has  about a 50-75, pack-year smoking history and continues to smoke cigarettes.  He says that he is stopped when he came to the hospital.  He says he has  been somewhat short of breath, but he did not realize how short of breath he  was.  He has had some cough but little production.  He has had some  wheezing.  He has not had any fever as far as he knows.   PAST MEDICAL HISTORY:  He says he has many degenerated disks in his back and  has ankylosing spondylitis.  In addition to that, he has diabetes mellitus  type 2, coronary occlusive disease, has had an MI, has hypertension.  His  medications currently are Tylox 5/500 one b.i.d. p.r.n. pain, metoprolol 25  mg daily, aspirin 325 mg daily, Xanax 0.5 t.i.d. p.r.n., Zocor 40 mg at  bedtime, tramadol 50 mg p.r.n. pain, and Zoloft 100 mg daily.   SOCIAL HISTORY:  Positive for his smoking history as mentioned.  He is  disabled because of his back problems.  He formally worked at [employer].   FAMILY HISTORY:  Positive apparently for COPD.   PHYSICAL EXAMINATION:  Physical exam shows he is awake and alert, says he is  feeling much better.  His temperature is 97.3, pulse 74, respirations 20,  blood sugar 198, blood pressure 122/72, O2 sats 94% on 2 liters.  His mucous  membranes are moist.  His  tympanic membranes are intact.  Pupils are  reactive.  His chest shows some rhonchi bilaterally, but he is moving air  well.  His heart is regular without gallop.  His abdomen is soft without  masses.  His extremities showed no edema.  He has multiple surgical scars on  his back.   LABORATORY WORK:  Blood gas on room air:  pH 7.37, pCO2 of 49, pO2 of 49.  BMET shows his CO2 is 32, glucose 125.  That of course is of interest in  that he does not have at least a markedly elevated CO2, suggesting that his  CO2 retention may be acute.  White count was 9900, hemoglobin 13.6.  D-dimer  was 1.08.  Cardiac enzymes were negative.  Chest CT showed no pulmonary  emboli, atheromatous changes in the thoracic aorta, a left upper quadrant  soft tissue mass, no pneumonia.  ASSESSMENT:  He has chronic obstructive pulmonary disease.  Clearly, if he  can stop smoking that will help him a great deal.  He is going to plan to  try to do that, he says.  I would like to continue with his medications the  meantime.  He also has symptoms of sleep apnea and needs to go ahead and  have a sleep study done at some point.  Thanks for allowing me to see him  with you.   Sincerely,      Edward L. Juanetta Gosling, M.D.  Electronically Signed     ELH/MEDQ  D:  10/28/2005  T:  10/28/2005  Job:  [id number]

## 2011-03-25 ENCOUNTER — Telehealth: Payer: Self-pay | Admitting: Cardiology

## 2011-03-25 MED ORDER — METFORMIN HCL 1000 MG PO TABS: 1000.0000 mg | ORAL_TABLET | Freq: Two times a day (BID) | ORAL | Status: AC

## 2011-03-25 NOTE — Telephone Encounter (Signed)
Pt wife calling stating that pt needs Metformin 1000 mg 1 po 2x/day, 3 month supply. Called into [facility] .   Pt wife said Dr. Daleen Squibb is taking care of pt diabetes medication too, per pt request.   Pt only has two more pills that will only last one day for pt. Please call this in ASAP.

## 2011-03-25 NOTE — Telephone Encounter (Signed)
Pt wife is aware medication called in. PCP is Dr. Renard Matter and she states he does not follow pt diabetes. Dr. Daleen Squibb does. Mylo Red RN

## 2011-04-10 ENCOUNTER — Other Ambulatory Visit: Payer: Self-pay | Admitting: Cardiology

## 2011-06-01 ENCOUNTER — Encounter: Payer: Self-pay | Admitting: *Deleted

## 2011-06-01 ENCOUNTER — Emergency Department (HOSPITAL_COMMUNITY): Payer: Medicare Other

## 2011-06-01 ENCOUNTER — Inpatient Hospital Stay (HOSPITAL_COMMUNITY)
Admission: EM | Admit: 2011-06-01 | Discharge: 2011-06-06 | DRG: 190 | Disposition: A | Discharge status: Home / Self Care | Payer: Medicare Other | Attending: Family Medicine | Admitting: Family Medicine

## 2011-06-01 ENCOUNTER — Other Ambulatory Visit: Payer: Self-pay

## 2011-06-01 DIAGNOSIS — J9610 Chronic respiratory failure, unspecified whether with hypoxia or hypercapnia: Secondary | ICD-10-CM

## 2011-06-01 DIAGNOSIS — R748 Abnormal levels of other serum enzymes: Secondary | ICD-10-CM

## 2011-06-01 DIAGNOSIS — I2510 Atherosclerotic heart disease of native coronary artery without angina pectoris: Secondary | ICD-10-CM | POA: Diagnosis present

## 2011-06-01 DIAGNOSIS — J811 Chronic pulmonary edema: Secondary | ICD-10-CM | POA: Diagnosis present

## 2011-06-01 DIAGNOSIS — M4722 Other spondylosis with radiculopathy, cervical region: Secondary | ICD-10-CM

## 2011-06-01 DIAGNOSIS — F1120 Opioid dependence, uncomplicated: Secondary | ICD-10-CM | POA: Diagnosis present

## 2011-06-01 DIAGNOSIS — G253 Myoclonus: Secondary | ICD-10-CM | POA: Diagnosis present

## 2011-06-01 DIAGNOSIS — E669 Obesity, unspecified: Secondary | ICD-10-CM | POA: Insufficient documentation

## 2011-06-01 DIAGNOSIS — I10 Essential (primary) hypertension: Secondary | ICD-10-CM | POA: Diagnosis present

## 2011-06-01 DIAGNOSIS — M47812 Spondylosis without myelopathy or radiculopathy, cervical region: Secondary | ICD-10-CM | POA: Diagnosis present

## 2011-06-01 DIAGNOSIS — W19XXXA Unspecified fall, initial encounter: Secondary | ICD-10-CM | POA: Diagnosis present

## 2011-06-01 DIAGNOSIS — J4489 Other specified chronic obstructive pulmonary disease: Principal | ICD-10-CM | POA: Diagnosis present

## 2011-06-01 DIAGNOSIS — E119 Type 2 diabetes mellitus without complications: Secondary | ICD-10-CM | POA: Diagnosis present

## 2011-06-01 DIAGNOSIS — I214 Non-ST elevation (NSTEMI) myocardial infarction: Secondary | ICD-10-CM | POA: Diagnosis present

## 2011-06-01 DIAGNOSIS — I509 Heart failure, unspecified: Secondary | ICD-10-CM

## 2011-06-01 DIAGNOSIS — I252 Old myocardial infarction: Secondary | ICD-10-CM

## 2011-06-01 DIAGNOSIS — J449 Chronic obstructive pulmonary disease, unspecified: Principal | ICD-10-CM | POA: Diagnosis present

## 2011-06-01 HISTORY — DX: Acute myocardial infarction, unspecified: I21.9

## 2011-06-01 HISTORY — DX: Chronic obstructive pulmonary disease, unspecified: J44.9

## 2011-06-01 HISTORY — DX: Dorsopathy, unspecified: M53.9

## 2011-06-01 HISTORY — DX: Spondylopathy, unspecified: M48.9

## 2011-06-01 LAB — BLOOD GAS, ARTERIAL
Bicarbonate: 31.9 mEq/L — ABNORMAL HIGH (ref 20.0–24.0)
FIO2: 21 %
TCO2: 27 mmol/L (ref 0–100)
pH, Arterial: 7.421 (ref 7.350–7.450)
pO2, Arterial: 41.6 mmHg — ABNORMAL LOW (ref 80.0–100.0)

## 2011-06-01 LAB — CBC
HCT: 51.7 % (ref 39.0–52.0)
MCV: 96.5 fL (ref 78.0–100.0)
RBC: 5.36 MIL/uL (ref 4.22–5.81)
RDW: 15.9 % — ABNORMAL HIGH (ref 11.5–15.5)

## 2011-06-01 LAB — CARDIAC PANEL(CRET KIN+CKTOT+MB+TROPI)
CK, MB: 5 ng/mL — ABNORMAL HIGH (ref 0.3–4.0)
Relative Index: INVALID (ref 0.0–2.5)
Total CK: 82 U/L (ref 7–232)
Troponin I: 1.54 ng/mL

## 2011-06-01 LAB — COMPREHENSIVE METABOLIC PANEL
ALT: 10 U/L (ref 0–53)
Albumin: 3.4 g/dL — ABNORMAL LOW (ref 3.5–5.2)
Alkaline Phosphatase: 65 U/L (ref 39–117)
BUN: 14 mg/dL (ref 6–23)
Calcium: 9.8 mg/dL (ref 8.4–10.5)
Chloride: 94 mEq/L — ABNORMAL LOW (ref 96–112)
Creatinine, Ser: 0.61 mg/dL (ref 0.50–1.35)
GFR calc non Af Amer: >90 mL/min (ref >90)
Glucose, Bld: 96 mg/dL (ref 70–99)
Sodium: 138 mEq/L (ref 135–145)

## 2011-06-01 LAB — GLUCOSE, CAPILLARY: Glucose-Capillary: 139 mg/dL — ABNORMAL HIGH (ref 70–99)

## 2011-06-01 LAB — URINALYSIS, ROUTINE W REFLEX MICROSCOPIC
Glucose, UA: NEGATIVE mg/dL
Urobilinogen, UA: 0.2 mg/dL (ref 0.0–1.0)

## 2011-06-01 LAB — TROPONIN I: Troponin I: 1.25 ng/mL (ref <0.30)

## 2011-06-01 LAB — MRSA PCR SCREENING: MRSA by PCR: NEGATIVE

## 2011-06-01 LAB — PRO B NATRIURETIC PEPTIDE: Pro B Natriuretic peptide (BNP): 6273 pg/mL — ABNORMAL HIGH (ref 0–125)

## 2011-06-01 MED ORDER — SODIUM CHLORIDE 0.9 % IJ SOLN
3.0000 mL | INTRAMUSCULAR | Status: DC | PRN
  Administered 2011-06-01: 3 mL via INTRAVENOUS
  Filled 2011-06-01: qty 3

## 2011-06-01 MED ORDER — INSULIN ASPART 100 UNIT/ML ~~LOC~~ SOLN
0.0000 [IU] | Freq: Three times a day (TID) | SUBCUTANEOUS | Status: DC
  Administered 2011-06-02 – 2011-06-03 (×5): 3 [IU] via SUBCUTANEOUS
  Administered 2011-06-04: 5 [IU] via SUBCUTANEOUS
  Administered 2011-06-04: 2 [IU] via SUBCUTANEOUS
  Administered 2011-06-04 – 2011-06-05 (×2): 3 [IU] via SUBCUTANEOUS
  Administered 2011-06-05: 8 [IU] via SUBCUTANEOUS
  Administered 2011-06-05 – 2011-06-06 (×2): 5 [IU] via SUBCUTANEOUS
  Administered 2011-06-06: 3 [IU] via SUBCUTANEOUS
  Filled 2011-06-01: qty 3

## 2011-06-01 MED ORDER — NITROGLYCERIN 2 % TD OINT
1.0000 [in_us] | TOPICAL_OINTMENT | Freq: Four times a day (QID) | TRANSDERMAL | Status: DC
  Administered 2011-06-01: 1 [in_us] via TOPICAL
  Filled 2011-06-01: qty 30

## 2011-06-01 MED ORDER — SODIUM CHLORIDE 0.9 % IJ SOLN
INTRAMUSCULAR | Status: AC
  Filled 2011-06-01: qty 3

## 2011-06-01 MED ORDER — MECLIZINE HCL 12.5 MG PO TABS
25.0000 mg | ORAL_TABLET | Freq: Two times a day (BID) | ORAL | Status: DC
  Administered 2011-06-01 – 2011-06-06 (×10): 25 mg via ORAL
  Filled 2011-06-01 (×3): qty 2
  Filled 2011-06-01: qty 1
  Filled 2011-06-01 (×6): qty 2

## 2011-06-01 MED ORDER — DOCUSATE SODIUM 100 MG PO CAPS
100.0000 mg | ORAL_CAPSULE | Freq: Two times a day (BID) | ORAL | Status: DC
  Administered 2011-06-01 – 2011-06-06 (×10): 100 mg via ORAL
  Filled 2011-06-01 (×10): qty 1

## 2011-06-01 MED ORDER — OXYCODONE HCL 5 MG PO TABS
5.0000 mg | ORAL_TABLET | ORAL | Status: DC | PRN
  Administered 2011-06-02 – 2011-06-06 (×11): 5 mg via ORAL
  Filled 2011-06-01 (×12): qty 1

## 2011-06-01 MED ORDER — ENOXAPARIN SODIUM 40 MG/0.4ML ~~LOC~~ SOLN
40.0000 mg | SUBCUTANEOUS | Status: DC
  Administered 2011-06-01: 40 mg via SUBCUTANEOUS
  Filled 2011-06-01: qty 0.4

## 2011-06-01 MED ORDER — SIMVASTATIN 20 MG PO TABS
40.0000 mg | ORAL_TABLET | Freq: Every day | ORAL | Status: DC
  Administered 2011-06-02 – 2011-06-03 (×2): 40 mg via ORAL
  Filled 2011-06-01 (×2): qty 2

## 2011-06-01 MED ORDER — ONDANSETRON HCL 4 MG PO TABS: 4.0000 mg | ORAL_TABLET | Freq: Four times a day (QID) | ORAL | Status: DC | PRN

## 2011-06-01 MED ORDER — SERTRALINE HCL 50 MG PO TABS
100.0000 mg | ORAL_TABLET | Freq: Every day | ORAL | Status: DC
  Administered 2011-06-01 – 2011-06-06 (×6): 100 mg via ORAL
  Filled 2011-06-01 (×3): qty 2
  Filled 2011-06-01: qty 1
  Filled 2011-06-01 (×2): qty 2

## 2011-06-01 MED ORDER — ACETAMINOPHEN 650 MG RE SUPP: 650.0000 mg | Freq: Four times a day (QID) | RECTAL | Status: DC | PRN

## 2011-06-01 MED ORDER — IPRATROPIUM BROMIDE 0.02 % IN SOLN
0.5000 mg | Freq: Once | RESPIRATORY_TRACT | Status: AC
  Administered 2011-06-01: 0.5 mg via RESPIRATORY_TRACT
  Filled 2011-06-01: qty 2.5

## 2011-06-01 MED ORDER — ALBUTEROL SULFATE (5 MG/ML) 0.5% IN NEBU
5.0000 mg | INHALATION_SOLUTION | Freq: Once | RESPIRATORY_TRACT | Status: AC
  Administered 2011-06-01: 5 mg via RESPIRATORY_TRACT
  Filled 2011-06-01: qty 1

## 2011-06-01 MED ORDER — FUROSEMIDE 40 MG PO TABS
40.0000 mg | ORAL_TABLET | Freq: Once | ORAL | Status: AC
  Administered 2011-06-01: 40 mg via ORAL
  Filled 2011-06-01: qty 1

## 2011-06-01 MED ORDER — NITROGLYCERIN 2 % TD OINT
1.0000 [in_us] | TOPICAL_OINTMENT | Freq: Four times a day (QID) | TRANSDERMAL | Status: DC
  Administered 2011-06-02 – 2011-06-05 (×13): 1 [in_us] via TOPICAL
  Filled 2011-06-01 (×14): qty 1

## 2011-06-01 MED ORDER — ONDANSETRON HCL 4 MG/2ML IJ SOLN: 4.0000 mg | Freq: Four times a day (QID) | INTRAMUSCULAR | Status: DC | PRN

## 2011-06-01 MED ORDER — METOPROLOL TARTRATE 25 MG PO TABS
25.0000 mg | ORAL_TABLET | Freq: Two times a day (BID) | ORAL | Status: DC
  Administered 2011-06-02 – 2011-06-03 (×3): 25 mg via ORAL
  Filled 2011-06-01 (×3): qty 1

## 2011-06-01 MED ORDER — BENAZEPRIL HCL 10 MG PO TABS
10.0000 mg | ORAL_TABLET | Freq: Every day | ORAL | Status: DC
  Administered 2011-06-02 – 2011-06-06 (×5): 10 mg via ORAL
  Filled 2011-06-01 (×5): qty 1

## 2011-06-01 MED ORDER — NITROGLYCERIN 2 % TD OINT
TOPICAL_OINTMENT | TRANSDERMAL | Status: AC
  Filled 2011-06-01: qty 1

## 2011-06-01 MED ORDER — SODIUM CHLORIDE 0.9 % IV SOLN
250.0000 mL | INTRAVENOUS | Status: DC | PRN
  Administered 2011-06-05: 10 mL via INTRAVENOUS

## 2011-06-01 MED ORDER — CLOPIDOGREL BISULFATE 75 MG PO TABS
75.0000 mg | ORAL_TABLET | Freq: Every day | ORAL | Status: DC
  Administered 2011-06-02 – 2011-06-03 (×2): 75 mg via ORAL
  Filled 2011-06-01 (×3): qty 1

## 2011-06-01 MED ORDER — ENOXAPARIN SODIUM 80 MG/0.8ML ~~LOC~~ SOLN: 1.0000 mg/kg | Freq: Once | SUBCUTANEOUS | Status: DC

## 2011-06-01 MED ORDER — INSULIN ASPART 100 UNIT/ML ~~LOC~~ SOLN: 0.0000 [IU] | Freq: Every day | SUBCUTANEOUS | Status: DC

## 2011-06-01 MED ORDER — ALPRAZOLAM 0.5 MG PO TABS
0.5000 mg | ORAL_TABLET | Freq: Three times a day (TID) | ORAL | Status: DC | PRN
Start: 2011-06-01 — End: 2011-06-06
  Administered 2011-06-02 – 2011-06-03 (×2): 0.5 mg via ORAL
  Filled 2011-06-01 (×3): qty 1

## 2011-06-01 MED ORDER — ACETAMINOPHEN 325 MG PO TABS: 650.0000 mg | ORAL_TABLET | Freq: Four times a day (QID) | ORAL | Status: DC | PRN

## 2011-06-01 MED ORDER — INFLUENZA VIRUS VACC SPLIT PF IM SUSP
0.5000 mL | INTRAMUSCULAR | Status: AC
  Administered 2011-06-02: 0.5 mL via INTRAMUSCULAR
  Filled 2011-06-01: qty 0.5

## 2011-06-01 MED ORDER — HEPARIN SOD (PORCINE) IN D5W 100 UNIT/ML IV SOLN
1750.0000 [IU]/h | INTRAVENOUS | Status: DC
  Administered 2011-06-01 – 2011-06-02 (×2): 1500 [IU]/h via INTRAVENOUS
  Administered 2011-06-03: 1750 [IU]/h via INTRAVENOUS
  Filled 2011-06-01 (×3): qty 250

## 2011-06-01 MED ORDER — POLYETHYLENE GLYCOL 3350 17 G PO PACK
17.0000 g | PACK | Freq: Every day | ORAL | Status: DC | PRN
  Filled 2011-06-01 (×3): qty 1

## 2011-06-01 MED ORDER — TRAZODONE HCL 50 MG PO TABS: 25.0000 mg | ORAL_TABLET | Freq: Every evening | ORAL | Status: DC | PRN

## 2011-06-01 MED ORDER — FUROSEMIDE 10 MG/ML IJ SOLN
40.0000 mg | Freq: Two times a day (BID) | INTRAMUSCULAR | Status: DC
  Administered 2011-06-01 – 2011-06-03 (×4): 40 mg via INTRAVENOUS
  Filled 2011-06-01 (×4): qty 4

## 2011-06-01 MED ORDER — ALUM & MAG HYDROXIDE-SIMETH 200-200-20 MG/5ML PO SUSP: 30.0000 mL | Freq: Four times a day (QID) | ORAL | Status: DC | PRN

## 2011-06-01 MED ORDER — ASPIRIN 81 MG PO CHEW
324.0000 mg | CHEWABLE_TABLET | Freq: Once | ORAL | Status: AC
  Administered 2011-06-01: 324 mg via ORAL
  Filled 2011-06-01: qty 4

## 2011-06-01 MED ORDER — ASPIRIN EC 325 MG PO TBEC
325.0000 mg | DELAYED_RELEASE_TABLET | Freq: Every day | ORAL | Status: DC
  Administered 2011-06-02 – 2011-06-06 (×5): 325 mg via ORAL
  Filled 2011-06-01 (×5): qty 1

## 2011-06-01 MED ORDER — LEVALBUTEROL HCL 0.63 MG/3ML IN NEBU
0.6300 mg | INHALATION_SOLUTION | Freq: Four times a day (QID) | RESPIRATORY_TRACT | Status: DC | PRN
  Administered 2011-06-02: 0.63 mg via RESPIRATORY_TRACT
  Filled 2011-06-01: qty 3

## 2011-06-01 MED ORDER — SODIUM CHLORIDE 0.9 % IJ SOLN
3.0000 mL | Freq: Two times a day (BID) | INTRAMUSCULAR | Status: DC
  Administered 2011-06-01 – 2011-06-06 (×3): 3 mL via INTRAVENOUS
  Filled 2011-06-01 (×4): qty 3

## 2011-06-01 MED ORDER — MOXIFLOXACIN HCL IN NACL 400 MG/250ML IV SOLN
400.0000 mg | INTRAVENOUS | Status: DC
  Administered 2011-06-01 – 2011-06-05 (×5): 400 mg via INTRAVENOUS
  Filled 2011-06-01 (×6): qty 250

## 2011-06-01 MED ORDER — MORPHINE SULFATE 2 MG/ML IJ SOLN
2.0000 mg | INTRAMUSCULAR | Status: DC | PRN
  Administered 2011-06-01 – 2011-06-06 (×9): 2 mg via INTRAVENOUS
  Filled 2011-06-01 (×10): qty 1

## 2011-06-01 MED ORDER — NITROGLYCERIN 0.4 MG SL SUBL: 0.4000 mg | SUBLINGUAL_TABLET | SUBLINGUAL | Status: DC | PRN

## 2011-06-01 NOTE — H&P (Signed)
[name] MRN: [id number] DOB/AGE: 01/08/1941 71 y.o. Primary Care Physician:MCINNIS,ANGUS G, MD Admit date: 06/01/2011 Chief Complaint: Shortness of breath HPI: This is a 71 year old who came to the emergency because of concerns that he may have had a stroke. He had fallen out of bed and hit his left shoulder. He had problems moving his left arm. He has a long known history of severe COPD and has a previous history of myocardial infarction but does not have a known history of congestive heart failure. He appear to be short of breath and as part of his workup he had a BNP that was elevated in his chest x-ray looks like he has some mild pulmonary edema. He also had an elevated troponin level. His EKG shows nonspecific changes and does not show an ST elevation acute MI. He says he short of breath but denies any chest pain. He is moving his left arm better now. He is chronically short of breath and according to his wife doesn't appear much different than he usually does.  Past Medical History  Diagnosis Date  . COPD (chronic obstructive pulmonary disease)   . Diabetes mellitus   . Spinal disease   . Myocardial infarct    Past Surgical History  Procedure Date  . Back surgery         No family history on file.  Social History:  reports that he has been smoking.  He does not have any smokeless tobacco history on file. He reports that he does not drink alcohol or use illicit drugs. He is married and lives at home with his wife. He has spinal stenosis and now is bedridden  Allergies:  Allergies  Allergen Reactions  . Codeine     Medications Prior to Admission  Medication Dose Route Frequency Provider Last Rate Last Dose  . albuterol (PROVENTIL) (5 MG/ML) 0.5% nebulizer solution 5 mg  5 mg Nebulization Once Celene Kras, MD   5 mg at 06/01/11 1503  . aspirin chewable tablet 324 mg  324 mg Oral Once Celene Kras, MD      . enoxaparin (LOVENOX) injection 1 mg/kg  1 mg/kg Subcutaneous  Once Celene Kras, MD      . furosemide (LASIX) tablet 40 mg  40 mg Oral Once Celene Kras, MD      . insulin aspart (novoLOG) injection 0-15 Units  0-15 Units Subcutaneous TID WC Fredirick Maudlin, MD      . insulin aspart (novoLOG) injection 0-5 Units  0-5 Units Subcutaneous QHS Fredirick Maudlin, MD      . ipratropium (ATROVENT) nebulizer solution 0.5 mg  0.5 mg Nebulization Once Celene Kras, MD   0.5 mg at 06/01/11 1503  . moxifloxacin (AVELOX) IVPB 400 mg  400 mg Intravenous Q24H Fredirick Maudlin, MD      . nitroGLYCERIN (NITROGLYN) 2 % ointment 1 inch  1 inch Topical Q6H Celene Kras, MD       Medications Prior to Admission  Medication Sig Dispense Refill  . metFORMIN (GLUCOPHAGE) 1000 MG tablet Take 1 tablet (1,000 mg total) by mouth 2 (two) times daily with a meal.  90 tablet  3  . ZOCOR 40 MG tablet TAKE ONE TABLET DAILY.  30 each  5       NGE:XBMWU from the symptoms mentioned above,there are no other symptoms referable to all systems reviewed.  Physical Exam: Blood pressure 142/64, pulse 83, temperature 99 F (37.2 C), temperature source Oral,  resp. rate 18, SpO2 90.00%. He is awake and alert. He is somewhat obese. His pupils are reactive. His nose and throat clear. His neck is supple without masses. His chest is relatively clear with some rales in the bases. His heart is regular without gallop. His abdomen is soft obese with no masses. He has chronic changes in his extremities but does not show edema. His central nervous system examination now is grossly intact except for his legs    Basename 06/01/11 1505  WBC 10.0  NEUTROABS --  HGB 16.8  HCT 51.7  MCV 96.5  PLT 152    Basename 06/01/11 1505  NA 138  K 4.7  CL 94*  CO2 34*  GLUCOSE 96  BUN 14  CREATININE 0.61  CALCIUM 9.8  MG --  lablast2(ast:2,ALT:2,alkphos:2,bilitot:2,prot:2,albumin:2)@    No results found for this or any previous visit (from the past 240 hour(s)).   Dg Chest 1 View  06/01/2011  *RADIOLOGY  REPORT*  Clinical Data: Cough.  CHEST - 1 VIEW  Comparison: Chest x-ray 86 06/2005.  Findings: The heart is mildly enlarged.  The mediastinal and hilar contours are within normal limits and stable.  There are chronic bronchitic type lung changes, likely related to smoking.  Low lung volumes with vascular crowding and areas of atelectasis.  No focal infiltrates, edema or effusions.  The bony thorax is intact.  IMPRESSION:  1.  Chronic bronchitic type lung changes and mild cardiac enlargement. 2.  Low lung volumes with vascular crowding and atelectasis.  Original Report Authenticated By: P. Loralie Champagne, M.D.   Ct Head Wo Contrast  06/01/2011  *RADIOLOGY REPORT*  Clinical Data: Larey Seat.  Lethargy.  CT HEAD WITHOUT CONTRAST  Technique:  Contiguous axial images were obtained from the base of the skull through the vertex without contrast.  Comparison: Head CT 12/14/2005.  Findings: Stable age related cerebral atrophy, ventriculomegaly and periventricular white matter disease.  Cavum septum pellucidum again noted.  No extra-axial fluid collections.  No CT findings for acute hemispheric infarction or intracranial hemorrhage.  No mass lesions.  The brainstem and cerebellum appear normal.  The bony structures are intact.  No skull fracture.  The paranasal sinuses and mastoid air cells are grossly clear.  There are a few scattered mucus retention cysts.  IMPRESSION:  1.  Stable age related cerebral atrophy, ventriculomegaly and periventricular white matter disease. 2.  No acute intracranial findings or mass lesions.  Original Report Authenticated By: P. Loralie Champagne, M.D.   Impression: He has abnormalities on lab work and chest x-ray suggesting congestive heart failure. He has an elevated troponin level that may be related to CHF or may be because he is having myocardial ischemia. He has pretty severe COPD. He has diabetes. Active Problems:  * No active hospital problems. *      Plan: He will be admitted to the  step down unit, started on Lasix, given nitroglycerin transdermally and less he starts having chest pain. He will be on standard dose Lovenox. I will hold his metformin and put him on sliding scale insulin. He will have Xopenex available if he has more trouble with shortness of breath. He will have serial cardiac enzymes and EKGs. I have ordered an echocardiogram. He says he normally sees Dr. Juanito Doom as far as his cardiology care is concerned      Rambo Sarafian L Pager [phone]  06/01/2011, 6:34 PM

## 2011-06-01 NOTE — ED Notes (Signed)
EMS reports pt has been lethargic since yesterday per wife. Pt is awake and alert on arrival. RA O2 sat of 77. O2 at 2L applied on arrival. Cough, present , having to clear throat frequently.

## 2011-06-01 NOTE — ED Provider Notes (Signed)
History     CSN: [id number]  Arrival date & time 06/01/11  1409   First MD Initiated Contact with Patient 06/01/11 1418      Chief Complaint  Patient presents with  . Altered Mental Status    (Consider location/radiation/quality/duration/timing/severity/associated sxs/prior treatment) HPI Pt states he fell out of the bed the other day and fell against his arm.  The next  day after however he was having trouble moving his right arm.  That seems to be a bit better.  Pt feels like he has no strength.  He has been coughing a  Lot recently as well.  He has not been bringing anything up with his cough. Patient has COPD and continues to smoke cigarettes.   His wife states he has said a few things that don't make sense.  He takes pain medications for his spinal problems but that makes him sleepy.  No fevers.  He has been sweaty at times.  No trouble with his speech.  Pt normally is not able to walk anymore.  Past Medical History  Diagnosis Date  . COPD (chronic obstructive pulmonary disease)   . Diabetes mellitus   . Spinal disease   . Myocardial infarct     Past Surgical History  Procedure Date  . Back surgery     No family history on file.  History  Substance Use Topics  . Smoking status: Current Everyday Smoker  . Smokeless tobacco: Not on file  . Alcohol Use: No      Review of Systems  All other systems reviewed and are negative.    Allergies  Codeine  Home Medications   Current Outpatient Rx  Name Route Sig Dispense Refill  . METFORMIN HCL 1000 MG PO TABS Oral Take 1 tablet (1,000 mg total) by mouth 2 (two) times daily with a meal. 90 tablet 3  . ZOCOR 40 MG PO TABS  TAKE ONE TABLET DAILY. 30 each 5    BP 142/64  Pulse 83  Temp(Src) 99 F (37.2 C) (Oral)  Resp 18  SpO2 77%  Physical Exam  Nursing note and vitals reviewed. Constitutional: He is oriented to person, place, and time. No distress.  HENT:  Head: Normocephalic and atraumatic.  Right Ear:  External ear normal.  Left Ear: External ear normal.  Eyes: Conjunctivae are normal. Right eye exhibits no discharge. Left eye exhibits no discharge. No scleral icterus.  Neck: Neck supple. No tracheal deviation present.  Cardiovascular: Normal rate, regular rhythm and intact distal pulses.   Pulmonary/Chest: Effort normal. No stridor. No respiratory distress. He has wheezes. He has no rales.  Abdominal: Soft. Bowel sounds are normal. He exhibits no distension. There is no tenderness. There is no rebound and no guarding.  Musculoskeletal: He exhibits no edema and no tenderness.       Poor perfusion bilateral lower extremities, no cyanosis  Neurological: He is alert and oriented to person, place, and time. He has normal strength. No sensory deficit. Cranial nerve deficit:  no gross defecits noted. He exhibits normal muscle tone. He displays no seizure activity. Coordination normal.       Slow responses but alert and oriented, able to hold both hands off bed for 5 seconds, able to lift both legs off the bed, sensation light touch is intact throughout,  Skin: Skin is warm and dry. No rash noted. He is not diaphoretic.  Psychiatric: He has a normal mood and affect.    ED Course  Procedures (including critical  care time)  Date: 06/01/2011  Rate: 86  Rhythm: normal sinus rhythm  QRS Axis: left  Intervals: normal  ST/T Wave abnormalities: nonspecific ST/T changes  Conduction Disutrbances:left posterior fascicular block  Narrative Interpretation: ST and T-wave changes consider lateral ischemia, possible inferior infarct age undetermined, frequent PVCs  Old EKG Reviewed: none available   Labs Reviewed  CBC - Abnormal; Notable for the following:    RDW 15.9 (*)    All other components within normal limits  COMPREHENSIVE METABOLIC PANEL - Abnormal; Notable for the following:    Chloride 94 (*)    CO2 34 (*)    Albumin 3.4 (*)    All other components within normal limits  PRO B NATRIURETIC  PEPTIDE - Abnormal; Notable for the following:    Pro B Natriuretic peptide (BNP) 6273.0 (*)    All other components within normal limits  TROPONIN I - Abnormal; Notable for the following:    Troponin I 1.25 (*)    All other components within normal limits  BLOOD GAS, ARTERIAL - Abnormal; Notable for the following:    pCO2 arterial 50.0 (*)    pO2, Arterial 41.6 (*)    Bicarbonate 31.9 (*)    Acid-Base Excess 7.4 (*)    All other components within normal limits  PROTIME-INR  I-STAT 3, BLOOD GAS (G3+)  URINALYSIS, ROUTINE W REFLEX MICROSCOPIC   Dg Chest 1 View  06/01/2011  *RADIOLOGY REPORT*  Clinical Data: Cough.  CHEST - 1 VIEW  Comparison: Chest x-ray 86 06/2005.  Findings: The heart is mildly enlarged.  The mediastinal and hilar contours are within normal limits and stable.  There are chronic bronchitic type lung changes, likely related to smoking.  Low lung volumes with vascular crowding and areas of atelectasis.  No focal infiltrates, edema or effusions.  The bony thorax is intact.  IMPRESSION:  1.  Chronic bronchitic type lung changes and mild cardiac enlargement. 2.  Low lung volumes with vascular crowding and atelectasis.  Original Report Authenticated By: P. Loralie Champagne, M.D.   Ct Head Wo Contrast  06/01/2011  *RADIOLOGY REPORT*  Clinical Data: Larey Seat.  Lethargy.  CT HEAD WITHOUT CONTRAST  Technique:  Contiguous axial images were obtained from the base of the skull through the vertex without contrast.  Comparison: Head CT 12/14/2005.  Findings: Stable age related cerebral atrophy, ventriculomegaly and periventricular white matter disease.  Cavum septum pellucidum again noted.  No extra-axial fluid collections.  No CT findings for acute hemispheric infarction or intracranial hemorrhage.  No mass lesions.  The brainstem and cerebellum appear normal.  The bony structures are intact.  No skull fracture.  The paranasal sinuses and mastoid air cells are grossly clear.  There are a few scattered  mucus retention cysts.  IMPRESSION:  1.  Stable age related cerebral atrophy, ventriculomegaly and periventricular white matter disease. 2.  No acute intracranial findings or mass lesions.  Original Report Authenticated By: P. Loralie Champagne, M.D.     1. Congestive heart failure   2. Chronic respiratory failure   3. Cardiac enzymes elevated       MDM  Patient appears to have elevations in his troponin as well as his BNP. Suspect that he has a component of congestive heart failure on top of his chronic pulmonary disease. He does have elevated PCO2 and decreased PO2 but has stable pH. The elevated troponin suggests possibility of acute cardiac ischemia as well although he has not had any trouble with chest pain and currently  does not have any pain. Patient initially stated that he wanted to go home but I explained to him that he could very well be having a heart attack and this may indeed be fatal. He changed his mind and decided to be admitted to the hospital. I ordered aspirin nitroglycerin Lasix and Lovenox. We'll consult his medical Dr. for admission and further treatment.  CRITICAL CARE Performed by: Celene Kras   Total critical care time:35  Critical care time was exclusive of separately billable procedures and treating other patients.  Critical care was necessary to treat or prevent imminent or life-threatening deterioration.  Critical care was time spent personally by me on the following activities: development of treatment plan with patient and/or surrogate as well as nursing, discussions with consultants, evaluation of patient's response to treatment, examination of patient, obtaining history from patient or surrogate, ordering and performing treatments and interventions, ordering and review of laboratory studies, ordering and review of radiographic studies, pulse oximetry and re-evaluation of patient's condition.         Celene Kras, MD 06/01/11 [id number]

## 2011-06-01 NOTE — ED Notes (Signed)
Critical lab reported to EDP Knapp. Trop 1.25.

## 2011-06-02 ENCOUNTER — Other Ambulatory Visit: Payer: Self-pay

## 2011-06-02 LAB — CARDIAC PANEL(CRET KIN+CKTOT+MB+TROPI)
CK, MB: 4 ng/mL (ref 0.3–4.0)
Relative Index: INVALID (ref 0.0–2.5)
Total CK: 63 U/L (ref 7–232)
Total CK: 54 U/L (ref 7–232)

## 2011-06-02 LAB — BLOOD GAS, ARTERIAL
Acid-Base Excess: 10.9 mmol/L — ABNORMAL HIGH (ref 0.0–2.0)
O2 Content: 5 L/min
O2 Saturation: 93.2 %
TCO2: 29.9 mmol/L (ref 0–100)
pCO2 arterial: 54.8 mmHg — ABNORMAL HIGH (ref 35.0–45.0)
pO2, Arterial: 67.9 mmHg — ABNORMAL LOW (ref 80.0–100.0)

## 2011-06-02 LAB — BASIC METABOLIC PANEL
BUN: 12 mg/dL (ref 6–23)
Chloride: 91 mEq/L — ABNORMAL LOW (ref 96–112)
Glucose, Bld: 117 mg/dL — ABNORMAL HIGH (ref 70–99)
Potassium: 4.2 mEq/L (ref 3.5–5.1)
Sodium: 140 mEq/L (ref 135–145)

## 2011-06-02 LAB — GLUCOSE, CAPILLARY: Glucose-Capillary: 152 mg/dL — ABNORMAL HIGH (ref 70–99)

## 2011-06-02 LAB — CBC
HCT: 54.1 % — ABNORMAL HIGH (ref 39.0–52.0)
Hemoglobin: 17.4 g/dL — ABNORMAL HIGH (ref 13.0–17.0)
MCH: 31 pg (ref 26.0–34.0)
MCHC: 32.2 g/dL (ref 30.0–36.0)
RBC: 5.61 MIL/uL (ref 4.22–5.81)

## 2011-06-02 LAB — HEPARIN LEVEL (UNFRACTIONATED): Heparin Unfractionated: 0.46 IU/mL (ref 0.30–0.70)

## 2011-06-02 NOTE — Consult Note (Signed)
ANTICOAGULATION CONSULT NOTE - Initial Consult  Pharmacy Consult for Heparin IV Indication: elevated troponin  Allergies  Allergen Reactions  . Codeine    Patient Measurements: Height: 6' (182.9 cm) Weight: 196 lb 3.4 oz (89 kg) IBW/kg (Calculated) : 77.6   Vital Signs: Temp: 98.2 F (36.8 C) (01/06 0400) Temp src: Axillary (01/06 0400) BP: 108/63 mmHg (01/06 0700) Pulse Rate: 81  (01/06 0700)  Labs:  Basename 06/02/11 0446 06/01/11 2042 06/01/11 1600 06/01/11 1505  HGB 17.4* -- -- 16.8  HCT 54.1* -- -- 51.7  PLT 167 -- -- 152  APTT -- -- -- --  LABPROT -- -- 14.5 --  INR -- -- 1.11 --  HEPARINUNFRC 0.46 -- -- --  CREATININE 0.67 -- -- 0.61  CKTOTAL 63 82 -- --  CKMB 4.0 5.0* -- --  TROPONINI 1.17* 1.54* -- 1.25*   Estimated Creatinine Clearance: 91.6 ml/min (by C-G formula based on Cr of 0.67).  Medical History: Past Medical History  Diagnosis Date  . COPD (chronic obstructive pulmonary disease)   . Diabetes mellitus   . Spinal disease   . Myocardial infarct    Medications:  Scheduled:    . albuterol  5 mg Nebulization Once  . aspirin  324 mg Oral Once  . aspirin EC  325 mg Oral Daily  . benazepril  10 mg Oral Daily  . clopidogrel  75 mg Oral Q breakfast  . docusate sodium  100 mg Oral BID  . furosemide  40 mg Intravenous Q12H  . furosemide  40 mg Oral Once  . influenza  inactive virus vaccine  0.5 mL Intramuscular Tomorrow-1000  . insulin aspart  0-15 Units Subcutaneous TID WC  . insulin aspart  0-5 Units Subcutaneous QHS  . ipratropium  0.5 mg Nebulization Once  . meclizine  25 mg Oral BID  . metoprolol tartrate  25 mg Oral BID  . moxifloxacin  400 mg Intravenous Q24H  . nitroGLYCERIN  1 inch Topical Q6H  . sertraline  100 mg Oral Daily  . simvastatin  40 mg Oral q1800  . sodium chloride  3 mL Intravenous Q12H  . sodium chloride      . DISCONTD: enoxaparin  40 mg Subcutaneous Q24H  . DISCONTD: enoxaparin  1 mg/kg Subcutaneous Once  . DISCONTD:  nitroGLYCERIN  1 inch Topical Q6H    Assessment: Heparin level therapeutic Goal of Therapy:  Heparin level 0.3-0.7 units/ml   Plan:  Continue heparin at current rate Labs per protocol, heparin level daily  Margo Aye, Blakeley Scheier A 06/02/2011,8:11 AM

## 2011-06-02 NOTE — Progress Notes (Addendum)
CRITICAL VALUE ALERT  Critical value received:  Troponin=1.54  Date of notification:  06/01/11  Time of notification:  2150  Critical value read back:yes  Nurse who received alert:  Jinny Sanders, RN   MD notified (1st page):    Time of first page:    MD notified (2nd page):  Time of second page:  Responding MD:  Dr. Juanetta Gosling called on cell phone.  Time MD responded:  2205 New order received to start heparin drip.  Nursing staff to continue to monitor.

## 2011-06-02 NOTE — Progress Notes (Signed)
NAMECASIMIRO, [name]              ACCOUNT NO.:  [id number]  MEDICAL RECORD NO.:  [id number]  LOCATION:  IC03                          FACILITY:  [facility]  PHYSICIAN:  Kolten Ryback G. Renard Matter, MD   DATE OF BIRTH:  [birth year]  DATE OF PROCEDURE: DATE OF DISCHARGE:                                PROGRESS NOTE   The patient history according to history had fallen out of bed, hitting his left shoulder.  He does have a history of COPD, previous MI.  A BNP was elevated.  X-ray showed some mild pulmonary edema and he had an elevated troponin level.  EKG showing nonspecific changes.  He does have diabetes as well.  He has spinal stenosis.  OBJECTIVE:  VITAL SIGNS:  Blood pressure 108/63, respirations 20, pulse 81, and temp 98.2. LUNGS:  Diminished breath sounds. HEART:  Regular rhythm. ABDOMEN:  No palpable organs or masses.  ASSESSMENT:  The patient is being treated for the following problems: Chronic obstructive pulmonary disease,  diabetes, congestive heart failure, possible underlying myocardial ischemia, and spinal stenosis. Continue current regimen.     Manveer Gomes G. Renard Matter, MD     AGM/MEDQ  D:  06/02/2011  T:  06/02/2011  Job:  [id number]

## 2011-06-02 NOTE — Progress Notes (Signed)
CRITICAL VALUE ALERT  Critical value received:  Troponin=1.17  Date of notification:  06/02/11  Time of notification:  0647  Critical value read back:yes  Nurse who received alert:  Jinny Sanders, RN  MD notified (1st page):    Time of first page:    MD notified (2nd page):  Time of second page:  Responding MD:    Time MD responded:   Value is less than previously reported critical value called to MD previously and was not called at this time.

## 2011-06-03 ENCOUNTER — Telehealth: Payer: Self-pay | Admitting: Cardiology

## 2011-06-03 ENCOUNTER — Encounter (HOSPITAL_COMMUNITY): Payer: Self-pay | Admitting: Adult Health

## 2011-06-03 DIAGNOSIS — I517 Cardiomegaly: Secondary | ICD-10-CM

## 2011-06-03 DIAGNOSIS — M4722 Other spondylosis with radiculopathy, cervical region: Secondary | ICD-10-CM

## 2011-06-03 DIAGNOSIS — I279 Pulmonary heart disease, unspecified: Secondary | ICD-10-CM

## 2011-06-03 DIAGNOSIS — J811 Chronic pulmonary edema: Secondary | ICD-10-CM | POA: Diagnosis present

## 2011-06-03 DIAGNOSIS — R748 Abnormal levels of other serum enzymes: Secondary | ICD-10-CM

## 2011-06-03 DIAGNOSIS — W19XXXA Unspecified fall, initial encounter: Secondary | ICD-10-CM | POA: Diagnosis present

## 2011-06-03 LAB — BASIC METABOLIC PANEL
Chloride: 87 mEq/L — ABNORMAL LOW (ref 96–112)
Creatinine, Ser: 0.67 mg/dL (ref 0.50–1.35)
GFR calc non Af Amer: >90 mL/min (ref >90)
Potassium: 3.2 mEq/L — ABNORMAL LOW (ref 3.5–5.1)
Sodium: 135 mEq/L (ref 135–145)

## 2011-06-03 LAB — GLUCOSE, CAPILLARY
Glucose-Capillary: 192 mg/dL — ABNORMAL HIGH (ref 70–99)
Glucose-Capillary: 194 mg/dL — ABNORMAL HIGH (ref 70–99)
Glucose-Capillary: 120 mg/dL — ABNORMAL HIGH (ref 70–99)
Glucose-Capillary: 170 mg/dL — ABNORMAL HIGH (ref 70–99)

## 2011-06-03 LAB — BLOOD GAS, ARTERIAL
Acid-Base Excess: 12 mmol/L — ABNORMAL HIGH (ref 0.0–2.0)
Bicarbonate: 37.5 mEq/L — ABNORMAL HIGH (ref 20.0–24.0)
TCO2: 31.6 mmol/L (ref 0–100)
pCO2 arterial: 64 mmHg (ref 35.0–45.0)
pH, Arterial: 7.386 (ref 7.350–7.450)

## 2011-06-03 LAB — CBC
HCT: 51.7 % (ref 39.0–52.0)
Hemoglobin: 16.7 g/dL (ref 13.0–17.0)
MCH: 31.5 pg (ref 26.0–34.0)
MCHC: 32.3 g/dL (ref 30.0–36.0)
MCV: 97.4 fL (ref 78.0–100.0)
RDW: 16.1 % — ABNORMAL HIGH (ref 11.5–15.5)

## 2011-06-03 LAB — HEPARIN LEVEL (UNFRACTIONATED): Heparin Unfractionated: 0.37 IU/mL (ref 0.30–0.70)

## 2011-06-03 MED ORDER — FUROSEMIDE 40 MG PO TABS
40.0000 mg | ORAL_TABLET | Freq: Every day | ORAL | Status: DC
  Administered 2011-06-04 – 2011-06-06 (×3): 40 mg via ORAL
  Filled 2011-06-03 (×3): qty 1

## 2011-06-03 MED ORDER — POTASSIUM CHLORIDE 10 MEQ/100ML IV SOLN
10.0000 meq | INTRAVENOUS | Status: AC
  Administered 2011-06-03 (×3): 10 meq via INTRAVENOUS
  Filled 2011-06-03: qty 300

## 2011-06-03 MED ORDER — HEPARIN SOD (PORCINE) IN D5W 100 UNIT/ML IV SOLN
INTRAVENOUS | Status: AC
  Filled 2011-06-03: qty 250

## 2011-06-03 NOTE — Consult Note (Signed)
CARDIOLOGY CONSULT NOTE  Patient ID: [name] MRN: [id number] DOB/AGE: 73-Jun-1942 73 y.o.  Admit date: 06/01/2011 Referring Physician: Renard Matter Primary PhysicianMCINNIS,ANGUS G, MD Primary Cardiologist: [place] Reason for Consultation: Pulmonary Edema and positive CE. Active Problems:  COPD  Pulmonary edema  CAD, UNSPECIFIED SITE  Fall  OBESITY  HPI: [name] is a 71 y/o patient of Dr. Daleen Squibb and Dr. Renard Matter with known history of CAD and MI with angioplasty  of unknown vessel approximately 15 years ago. This is per review of records from Dr. Vern Claude last documentation of encounter with the patient was in 2012.  He is being treated medically . At the time of that encounter he was wheelchair bound. Other history includes COPD, diabetes, hypercholesterolemia and severely debilitating spinal disease.       He was admitted after complaining of not being able to use his left arm. His wife, who is at bedside, states that over the last few weeks he was complaining of pain in both of his arms. He has been bed ridden for about a year secondary to progressive spondylosis. He is on frequent pain medications. He has been "in and out" of confusion and sleep for the last few days,having a progressive cough and congestion. He was not found to be in CHF per chest -x-ray, showing only chronic bronchitic type lung changes and mild cardiac enlargement, low lung volumes with vascular crowding and atelectasis. BNP was elevated at 6273. He has been given IV lasix with a 6 lb weight loss since admission. He is also being treated with antibiotics for bronchitis.      Cardiac enzymes were obtained and found to be positive at 1.25, 1.54 and then have trended downward with latest at 0.89 . CPK was normal, with one elevation of CK-MB on admission at 5.0. Initial EKG did not show acute ischemia, there was evidence of a prior inferior MI, however, subsequent EKG the following day showed T-wave inversion in the  anterior leads, with T-wave flattening in the lateral leads. He has been placed on heparin and plavix, by PCP. He was hypokalemic this am but this has been replaced.  Review of systems complete and found to be negative unless listed above   Past Medical History  Diagnosis Date  . COPD (chronic obstructive pulmonary disease)   . Diabetes mellitus   . Spinal disease   . Myocardial infarct     Family History  Problem Relation Age of Onset  . COPD Father     History   Social History  . Marital Status: Married    Spouse Name: N/A    Number of Children: N/A  . Years of Education: N/A   Occupational History  . Not on file.   Social History Main Topics  . Smoking status: Current Everyday Smoker  . Smokeless tobacco: Not on file  . Alcohol Use: No  . Drug Use: No  . Sexually Active:    Other Topics Concern  . Not on file   Social History Narrative  . No narrative on file    Past Surgical History  Procedure Date  . Back surgery      Prescriptions prior to admission  Medication Sig Dispense Refill  . ALPRAZolam (XANAX) 0.5 MG tablet Take 0.5 mg by mouth 3 (three) times daily as needed. anxiety       . benazepril (LOTENSIN) 10 MG tablet Take 10 mg by mouth daily.        . meclizine (ANTIVERT) 25 MG  tablet Take 25 mg by mouth 2 (two) times daily.        . metFORMIN (GLUCOPHAGE) 1000 MG tablet Take 1 tablet (1,000 mg total) by mouth 2 (two) times daily with a meal.  90 tablet  3  . metoprolol tartrate (LOPRESSOR) 25 MG tablet Take 25 mg by mouth 2 (two) times daily.        Marland Kitchen oxyCODONE-acetaminophen (PERCOCET) 7.5-500 MG per tablet Take 1 tablet by mouth 3 (three) times daily as needed. pain       . sertraline (ZOLOFT) 100 MG tablet Take 100 mg by mouth daily.        . traMADol (ULTRAM) 50 MG tablet Take 100 mg by mouth 2 (two) times daily as needed. pain       . ZOCOR 40 MG tablet TAKE ONE TABLET DAILY.  30 each  5    Physical Exam: General: Well developed, well  nourished, in no acute distress Head: Eyes PERRLA, No xanthomas.   Normal cephalic and atramatic  Lungs: Diminished breath sounds with poor inspiratory effort; prolonged exp phase with exp wheeze Heart: HRRR S1 S2, 1/6 systolic murmur.  Pulses are 2+ & equal.            No carotid bruit. No JVD.  No abdominal bruits. No femoral bruits. Abdomen: Bowel sounds are positive, abdomen soft and non-tender without masses or   Hernia's noted. Msk: Diminished strength and tone for age, generalized. Extremities: No clubbing, cyanosis or edema.  DP +1 Neuro: Alert and oriented X 3. Psych: Flat affect, responds appropriately  Labs:   Lab Results  Component Value Date   WBC 11.0* 06/03/2011   HGB 16.7 06/03/2011   HCT 51.7 06/03/2011   MCV 97.4 06/03/2011   PLT 168 06/03/2011     Lab 06/03/11 0419 06/01/11 1505  NA 135 --  K 3.2* --  CL 87* --  CO2 41* --  BUN 13 --  CREATININE 0.67 --  CALCIUM 9.8 --  PROT -- 7.6  BILITOT -- 0.5  ALKPHOS -- 65  ALT -- 10  AST -- 17  GLUCOSE 147* --   Lab Results  Component Value Date   CKTOTAL 54 06/02/2011   CKMB 3.6 06/02/2011   TROPONINI 0.89* 06/02/2011    Lab Results  Component Value Date   CHOL 97 07/24/2010   CHOL 105 10/27/2009   CHOL 120 11/10/2008   Lab Results  Component Value Date   HDL 27.30* 07/24/2010   HDL 28.90* 10/27/2009   HDL 78.29* 11/10/2008   No results found for this basename: LDLCALC   Lab Results  Component Value Date   TRIG 291.0* 07/24/2010   TRIG 358.0* 10/27/2009   TRIG 328.0* 11/10/2008   Lab Results  Component Value Date   CHOLHDL 4 07/24/2010   CHOLHDL 4 10/27/2009   CHOLHDL 4 11/10/2008   Lab Results  Component Value Date   LDLDIRECT 43.7 07/24/2010   LDLDIRECT 40.0 10/27/2009   LDLDIRECT 53.5 11/10/2008      Radiology: Chest X-Ray 06/01/2011 IMPRESSION: 1. Chronic bronchitic type lung changes and mild cardiac enlargement. 2. Low lung volumes with vascular crowding and atelectasis.  CT Scan of Head 06/01/2011 1. Stable  age related cerebral atrophy, ventriculomegaly and periventricular white matter disease. 2. No acute intracranial findings or mass lesions.  EKG: NSR with rate of 87 bpm. Antero/lateral T-wave inversion. Inferior ischemia.   ASSESSMENT AND PLAN:   1. NSTEMI:  There are new T-wave changes in the  antero/lateral leads with mildly elevated troponin. Highest at 1.54 but are trending downward. He had been complaining of nonspecific bilateral arm pain. Will review echocardiogram for changes in LV fx. He remains on NTG paste, heparin, plavix and metoprolol without wheezes. With severely debilitatated condition related to spondylosis would be conservative in management with no invasive cardiac testing.   2. CHF: He had elevated BNP on admission and has been on IV lasix. Wt is down 6 lbs but chest x-ray did not show pulmonary edema. Creatinine is stable and WNL even after diureses. Uncertain of dry wt. . Elevated troponin may be from hypoxia in the setting of CHF. Echo will be helpful in management of fluid status.  3. Narcotic dependence: He is on oxycodone TID and requires this daily along with Tramadol for severe back pain with radiculopathy.. Concerns that he may have had confusion and lethargy from these medications, with respiratory depression.   Signed: Bettey Mare. Lyman Bishop NP [facility] 06/03/2011, 4:48 PM   Cardiology Attending Patient interviewed and examined. Discussed with Joni Reining, NP.  Above note annotated and modified based upon my findings.  Minimally elevated troponin with normal CPK/MB-doubt ACS.  D/C heparin and clopidigrel.  No definite CHF either-change to PO furosemide.  Impaired mental status-pt's wife requests neurology consult.  She also would like pt seen by Dr. Fransico Him for management of diabetes.  Dr. Renard Matter to determine whether these evaluations are necessary.  We will evaluate LV function with echo.  Although he has some bronchospasm, he appears to be tolerating  B-blocker fairly well; nonetheless pulmonary function is his most serious issue.  Will d/c metoprolol.   Would minimize narcotics and sedatives.  [place] Bing, MD 06/03/2011, 6:05 PM

## 2011-06-03 NOTE — Consult Note (Signed)
Consult requested by: Dr. Renard Matter Consult requested for decreased level of consciousness chronic respiratory failure:  HPI: This is a 71 year old who was admitted earlier this weekend with shortness of breath what looked like some CHF and elevated troponin levels. This morning he was very difficult to arouse. His blood gas showed his PCO2 was 64 but his pH was 7.38. It looks like he probably has some chronic respiratory failure. His wife says that he sometimes snores and gasps during sleep and she thinks that he briefly stopped breathing. He does fall          asleep very quickly during the day.  Past Medical History  Diagnosis Date  . COPD (chronic obstructive pulmonary disease)   . Diabetes mellitus   . Spinal disease   . Myocardial infarct      No family history on file.   History   Social History  . Marital Status: Married    Spouse Name: N/A    Number of Children: N/A  . Years of Education: N/A   Social History Main Topics  . Smoking status: Current Everyday Smoker  . Smokeless tobacco: None  . Alcohol Use: No  . Drug Use: No  . Sexually Active:    Other Topics Concern  . None   Social History Narrative  . None     ROS: His wife says that he didn't sleep for 23 days prior to admission which may have something to do with this    Objective: Vital signs in last 24 hours: Temp:  [98.2 F (36.8 C)-99.1 F (37.3 C)] 98.7 F (37.1 C) (01/07 0400) Pulse Rate:  [70-95] 75  (01/07 0400) Resp:  [17-27] 22  (01/07 0400) BP: (97-143)/(49-94) 122/70 mmHg (01/07 0400) SpO2:  [88 %-97 %] 94 % (01/07 0400) Weight:  [86.3 kg (190 lb 4.1 oz)] 190 lb 4.1 oz (86.3 kg) (01/07 0500) Weight change: -3.7 kg (-8 lb 2.5 oz) Last BM Date: 06/01/11  Intake/Output from previous day: 01/06 0701 - 01/07 0700 In: 2147 [P.O.:1320; I.V.:315; IV Piggyback:512] Out: 1075 [Urine:1075]  PHYSICAL EXAM He is awake now but does not appear to be back to mental status when I admitted him. His  pupils are reactive. His nose and throat are clear. His neck is supple. His chest shows some rhonchi bilaterally. His heart is regular without gallop. His abdomen is soft. He has chronic changes in his lower extremities. His central nervous system examination showed that he seems still a little bit sluggish.  Lab Results: Basic Metabolic Panel:  Basename 06/03/11 0419 06/02/11 0446  NA 135 140  K 3.2* 4.2  CL 87* 91*  CO2 41* 37*  GLUCOSE 147* 117*  BUN 13 12  CREATININE 0.67 0.67  CALCIUM 9.8 10.4  MG -- --  PHOS -- --   Liver Function Tests:  Basename 06/01/11 1505  AST 17  ALT 10  ALKPHOS 65  BILITOT 0.5  PROT 7.6  ALBUMIN 3.4*   No results found for this basename: LIPASE:2,AMYLASE:2 in the last 72 hours No results found for this basename: AMMONIA:2 in the last 72 hours CBC:  Basename 06/03/11 0419 06/02/11 0446  WBC 11.0* 9.0  NEUTROABS -- --  HGB 16.7 17.4*  HCT 51.7 54.1*  MCV 97.4 96.4  PLT 168 167   Cardiac Enzymes:  Basename 06/02/11 1125 06/02/11 0446 06/01/11 2042  CKTOTAL 54 63 82  CKMB 3.6 4.0 5.0*  CKMBINDEX -- -- --  TROPONINI 0.89* 1.17* 1.54*   BNP:  Basename 06/01/11 1505  PROBNP 6273.0*   D-Dimer: No results found for this basename: DDIMER:2 in the last 72 hours CBG:  Basename 06/02/11 2130 06/02/11 1629 06/02/11 1145 06/02/11 0723 06/01/11 2128  GLUCAP 185* 166* 157* 152* 139*   Hemoglobin A1C:  Basename 06/01/11 1505  HGBA1C 7.2*   Fasting Lipid Panel: No results found for this basename: CHOL,HDL,LDLCALC,TRIG,CHOLHDL,LDLDIRECT in the last 72 hours Thyroid Function Tests: No results found for this basename: TSH,T4TOTAL,FREET4,T3FREE,THYROIDAB in the last 72 hours Anemia Panel: No results found for this basename: VITAMINB12,FOLATE,FERRITIN,TIBC,IRON,RETICCTPCT in the last 72 hours Coagulation:  Basename 06/01/11 1600  LABPROT 14.5  INR 1.11   Urine Drug Screen: Drugs of Abuse  No results found for this basename: labopia,  cocainscrnur, labbenz, amphetmu, thcu, labbarb    Alcohol Level: No results found for this basename: ETH:2 in the last 72 hours Urinalysis:  Misc. Labs:   ABGS:  Basename 06/03/11 0646  PHART 7.386  PO2ART 67.4*  TCO2 31.6  HCO3 37.5*     MICROBIOLOGY: Recent Results (from the past 240 hour(s))  MRSA PCR SCREENING     Status: Normal   Collection Time   06/01/11  8:04 PM      Component Value Range Status Comment   MRSA by PCR NEGATIVE  NEGATIVE  Final     Studies/Results: Dg Chest 1 View  06/01/2011  *RADIOLOGY REPORT*  Clinical Data: Cough.  CHEST - 1 VIEW  Comparison: Chest x-ray 86 06/2005.  Findings: The heart is mildly enlarged.  The mediastinal and hilar contours are within normal limits and stable.  There are chronic bronchitic type lung changes, likely related to smoking.  Low lung volumes with vascular crowding and areas of atelectasis.  No focal infiltrates, edema or effusions.  The bony thorax is intact.  IMPRESSION:  1.  Chronic bronchitic type lung changes and mild cardiac enlargement. 2.  Low lung volumes with vascular crowding and atelectasis.  Original Report Authenticated By: P. Loralie Champagne, M.D.   Ct Head Wo Contrast  06/01/2011  *RADIOLOGY REPORT*  Clinical Data: Larey Seat.  Lethargy.  CT HEAD WITHOUT CONTRAST  Technique:  Contiguous axial images were obtained from the base of the skull through the vertex without contrast.  Comparison: Head CT 12/14/2005.  Findings: Stable age related cerebral atrophy, ventriculomegaly and periventricular white matter disease.  Cavum septum pellucidum again noted.  No extra-axial fluid collections.  No CT findings for acute hemispheric infarction or intracranial hemorrhage.  No mass lesions.  The brainstem and cerebellum appear normal.  The bony structures are intact.  No skull fracture.  The paranasal sinuses and mastoid air cells are grossly clear.  There are a few scattered mucus retention cysts.  IMPRESSION:  1.  Stable age related  cerebral atrophy, ventriculomegaly and periventricular white matter disease. 2.  No acute intracranial findings or mass lesions.  Original Report Authenticated By: P. Loralie Champagne, M.D.    Medications:  Prior to Admission:  Prescriptions prior to admission  Medication Sig Dispense Refill  . ALPRAZolam (XANAX) 0.5 MG tablet Take 0.5 mg by mouth 3 (three) times daily as needed. anxiety       . benazepril (LOTENSIN) 10 MG tablet Take 10 mg by mouth daily.        . meclizine (ANTIVERT) 25 MG tablet Take 25 mg by mouth 2 (two) times daily.        . metFORMIN (GLUCOPHAGE) 1000 MG tablet Take 1 tablet (1,000 mg total) by mouth 2 (two) times daily with  a meal.  90 tablet  3  . metoprolol tartrate (LOPRESSOR) 25 MG tablet Take 25 mg by mouth 2 (two) times daily.        Marland Kitchen oxyCODONE-acetaminophen (PERCOCET) 7.5-500 MG per tablet Take 1 tablet by mouth 3 (three) times daily as needed. pain       . sertraline (ZOLOFT) 100 MG tablet Take 100 mg by mouth daily.        . traMADol (ULTRAM) 50 MG tablet Take 100 mg by mouth 2 (two) times daily as needed. pain       . ZOCOR 40 MG tablet TAKE ONE TABLET DAILY.  30 each  5   Scheduled:   . aspirin EC  325 mg Oral Daily  . benazepril  10 mg Oral Daily  . clopidogrel  75 mg Oral Q breakfast  . docusate sodium  100 mg Oral BID  . furosemide  40 mg Intravenous Q12H  . influenza  inactive virus vaccine  0.5 mL Intramuscular Tomorrow-1000  . insulin aspart  0-15 Units Subcutaneous TID WC  . insulin aspart  0-5 Units Subcutaneous QHS  . meclizine  25 mg Oral BID  . metoprolol tartrate  25 mg Oral BID  . moxifloxacin  400 mg Intravenous Q24H  . nitroGLYCERIN  1 inch Topical Q6H  . potassium chloride  10 mEq Intravenous Q1 Hr x 3  . sertraline  100 mg Oral Daily  . simvastatin  40 mg Oral q1800  . sodium chloride  3 mL Intravenous Q12H  . sodium chloride       Continuous:   . heparin 1,500 Units/hr (06/02/11 1221)   OZH:YQMVHQ chloride, acetaminophen,  acetaminophen, ALPRAZolam, alum & mag hydroxide-simeth, levalbuterol, morphine, nitroGLYCERIN, ondansetron (ZOFRAN) IV, ondansetron, oxyCODONE, polyethylene glycol, sodium chloride, traZODone  Assesment: He was difficult to arouse this morning and had elevated PCO2. However it looks like his elevated PCO2 is probably a chronic problem since he has a normal pH. I think he probably does have some severe COPD with chronic respiratory failure in he very well may have sleep apnea as well. He has multiple other medical problems including spinal stenosis coronary artery occlusive disease and CHF. Active Problems:  * No active hospital problems. *     Plan: Is not totally clear that we can say that his difficulty with arousal was related to elevated PCO2. However I suspect that what happened is that he may have some sleep apnea and probably had a much higher CO2 at one point or another and as he was being evaluated he is respirations increased and his CO2 went down some back to baseline. He will need to be monitored closely. He's going to need a sleep study. He has cardiology consultation scheduled for today.    LOS: 2 days   Lynard Postlewait L 06/03/2011, 7:51 AM

## 2011-06-03 NOTE — Telephone Encounter (Signed)
Pt in hospital [place] for CHF, poss heart attack, consult called in, wife has questions

## 2011-06-03 NOTE — Progress Notes (Signed)
[name], [name]              ACCOUNT NO.:  [id number]  MEDICAL RECORD NO.:  [id number]  LOCATION:  IC03                          FACILITY:  [facility]  PHYSICIAN:  Weston Kallman G. Renard Matter, MD   DATE OF BIRTH:  21-Jul-1940  DATE OF PROCEDURE: DATE OF DISCHARGE:                                PROGRESS NOTE   This patient has fallen out of bed at home, hitting his left shoulder. Does have a history of COPD, previous MI.  BNP was elevated.  X-ray show some mild pulmonary edema. Had slightly elevated troponin level.  EKG showed nonspecific changes.  Does have a spinal stenosis and diabetes as well.  OBJECTIVE:  VITAL SIGNS:  Blood pressure 122/70, respirations 22, pulse 75, temp 98.7.  He is a LUNGS:  Diminished breath sounds. HEART:  Regular rhythm. ABDOMEN:  No palpable organs or masses.  His troponin initially was 1.17, now is 0.89.  He does have a low serum potassium 3.2.  ASSESSMENT:  The patient has the following problems: 1. Chronic obstructive pulmonary disease. 2. Diabetes. 3. Congestive heart failure. 4. Possible underlying myocardial ischemia and spinal stenosis. 5. Does have hypokalemia.  PLAN:  To address his issue.  Continue current regimen.  Cardiology consult.     Merton Wadlow G. Renard Matter, MD     AGM/MEDQ  D:  06/03/2011  T:  06/03/2011  Job:  [id number]

## 2011-06-03 NOTE — Consult Note (Signed)
ANTICOAGULATION CONSULT NOTE   Pharmacy Consult for Heparin IV Indication: elevated troponin  Allergies  Allergen Reactions  . Codeine    Patient Measurements: Height: 6' (182.9 cm) Weight: 190 lb 4.1 oz (86.3 kg) IBW/kg (Calculated) : 77.6   Vital Signs: Temp: 98.7 F (37.1 C) (01/07 0400) Temp src: Oral (01/07 0400) BP: 127/80 mmHg (01/07 0700) Pulse Rate: 93  (01/07 0700)  Labs:  Basename 06/03/11 0419 06/02/11 1125 06/02/11 0446 06/01/11 2042 06/01/11 1600 06/01/11 1505  HGB 16.7 -- 17.4* -- -- --  HCT 51.7 -- 54.1* -- -- 51.7  PLT 168 -- 167 -- -- 152  APTT -- -- -- -- -- --  LABPROT -- -- -- -- 14.5 --  INR -- -- -- -- 1.11 --  HEPARINUNFRC 0.17* -- 0.46 -- -- --  CREATININE 0.67 -- 0.67 -- -- 0.61  CKTOTAL -- 54 63 82 -- --  CKMB -- 3.6 4.0 5.0* -- --  TROPONINI -- 0.89* 1.17* 1.54* -- --   Estimated Creatinine Clearance: 91.6 ml/min (by C-G formula based on Cr of 0.67).  Medical History: Past Medical History  Diagnosis Date  . COPD (chronic obstructive pulmonary disease)   . Diabetes mellitus   . Spinal disease   . Myocardial infarct    Medications:  Scheduled:     . aspirin EC  325 mg Oral Daily  . benazepril  10 mg Oral Daily  . clopidogrel  75 mg Oral Q breakfast  . docusate sodium  100 mg Oral BID  . furosemide  40 mg Intravenous Q12H  . influenza  inactive virus vaccine  0.5 mL Intramuscular Tomorrow-1000  . insulin aspart  0-15 Units Subcutaneous TID WC  . insulin aspart  0-5 Units Subcutaneous QHS  . meclizine  25 mg Oral BID  . metoprolol tartrate  25 mg Oral BID  . moxifloxacin  400 mg Intravenous Q24H  . nitroGLYCERIN  1 inch Topical Q6H  . potassium chloride  10 mEq Intravenous Q1 Hr x 3  . sertraline  100 mg Oral Daily  . simvastatin  40 mg Oral q1800  . sodium chloride  3 mL Intravenous Q12H    Assessment: Heparin level below goal  Goal of Therapy:  Heparin level 0.3-0.7 units/ml   Plan: Increase heparin to 1750  units/hr Re-check level in 6hrs then daily Adjust rate to maintain goal  Valrie Hart A 06/03/2011,8:34 AM

## 2011-06-03 NOTE — Progress Notes (Signed)
Nutrition Note  Low Braden score noted. Chart reviewed. Pt oral intake adequate to meet current est nutr needs. No additional nutritonal intervention indicated at this time.   Dietitian- [phone]

## 2011-06-03 NOTE — Telephone Encounter (Signed)
Spoke withy [name], answered questions and reassured her.

## 2011-06-03 NOTE — Progress Notes (Signed)
*  PRELIMINARY RESULTS* Echocardiogram 2D Echocardiogram has been performed.  Conrad Catharine 06/03/2011, 1:58 PM

## 2011-06-03 NOTE — Progress Notes (Signed)
CARE MANAGEMENT NOTE 06/03/2011  Patient:  [name], [name]   Account Number:  [id number]  Date Initiated:  06/03/2011  Documentation initiated by:  Rosemary Holms  Subjective/Objective Assessment:   Pt admitted from home with CHF and elevated troponin levels. PTA lived at home with his spouse. Bedridden and has very little strength in his arms.     Action/Plan:   Spoke to pt and wife at bedside. Wife is very concerned about caring for pt at home. Pt has previously refused NH placement.   Anticipated DC Date:  06/07/2011   Anticipated DC Plan:  HOME W HOME HEALTH SERVICES      DC Planning Services  CM consult      Choice offered to / List presented to:             Status of service:  In process, will continue to follow Medicare Important Message given?   (If response is "NO", the following Medicare IM given date fields will be blank) Date Medicare IM given:   Date Additional Medicare IM given:    Discharge Disposition:    Per UR Regulation:    Comments:  06/02/10 1030 [name] Girgis RN BSN CM wife states that pt may need [name] lift since she can not support him for transfer to [facility]) At minimum will [facility] RN and aide. CM would suggest pt needs total care.

## 2011-06-04 ENCOUNTER — Inpatient Hospital Stay (HOSPITAL_COMMUNITY): Admit: 2011-06-04 | Discharge: 2011-06-04 | Disposition: A | Discharge status: Home / Self Care | Payer: Medicare Other

## 2011-06-04 DIAGNOSIS — I252 Old myocardial infarction: Secondary | ICD-10-CM

## 2011-06-04 LAB — GLUCOSE, CAPILLARY
Glucose-Capillary: 188 mg/dL — ABNORMAL HIGH (ref 70–99)
Glucose-Capillary: 211 mg/dL — ABNORMAL HIGH (ref 70–99)
Glucose-Capillary: 125 mg/dL — ABNORMAL HIGH (ref 70–99)

## 2011-06-04 LAB — BLOOD GAS, ARTERIAL
Acid-Base Excess: 10 mmol/L — ABNORMAL HIGH (ref 0.0–2.0)
O2 Content: 6 L/min
pCO2 arterial: 65.5 mmHg (ref 35.0–45.0)
pH, Arterial: 7.356 (ref 7.350–7.450)
pO2, Arterial: 80 mmHg (ref 80.0–100.0)

## 2011-06-04 LAB — BASIC METABOLIC PANEL
BUN: 18 mg/dL (ref 6–23)
Calcium: 9 mg/dL (ref 8.4–10.5)
Creatinine, Ser: 0.81 mg/dL (ref 0.50–1.35)
GFR calc non Af Amer: 87 mL/min — ABNORMAL LOW (ref >90)
Glucose, Bld: 301 mg/dL — ABNORMAL HIGH (ref 70–99)
Sodium: 135 mEq/L (ref 135–145)

## 2011-06-04 MED ORDER — SODIUM CHLORIDE 0.9 % IJ SOLN
INTRAMUSCULAR | Status: AC
  Filled 2011-06-04: qty 3

## 2011-06-04 MED ORDER — INSULIN GLARGINE 100 UNIT/ML ~~LOC~~ SOLN
15.0000 [IU] | Freq: Every day | SUBCUTANEOUS | Status: DC
  Administered 2011-06-04: 15 [IU] via SUBCUTANEOUS

## 2011-06-04 MED ORDER — ENOXAPARIN SODIUM 40 MG/0.4ML ~~LOC~~ SOLN
40.0000 mg | Freq: Every day | SUBCUTANEOUS | Status: DC
  Administered 2011-06-04 – 2011-06-06 (×3): 40 mg via SUBCUTANEOUS
  Filled 2011-06-04 (×3): qty 0.4

## 2011-06-04 MED ORDER — DILTIAZEM HCL 30 MG PO TABS
30.0000 mg | ORAL_TABLET | Freq: Three times a day (TID) | ORAL | Status: DC
  Administered 2011-06-04 – 2011-06-05 (×3): 30 mg via ORAL
  Filled 2011-06-04 (×3): qty 1

## 2011-06-04 MED ORDER — TIOTROPIUM BROMIDE MONOHYDRATE 18 MCG IN CAPS
18.0000 ug | ORAL_CAPSULE | Freq: Every day | RESPIRATORY_TRACT | Status: DC
  Administered 2011-06-04 – 2011-06-06 (×3): 18 ug via RESPIRATORY_TRACT
  Filled 2011-06-04: qty 5

## 2011-06-04 MED ORDER — ROSUVASTATIN CALCIUM 5 MG PO TABS
10.0000 mg | ORAL_TABLET | Freq: Every day | ORAL | Status: DC
  Administered 2011-06-05: 10 mg via ORAL
  Filled 2011-06-04 (×2): qty 1

## 2011-06-04 MED ORDER — BUDESONIDE-FORMOTEROL FUMARATE 160-4.5 MCG/ACT IN AERO
2.0000 | INHALATION_SPRAY | Freq: Two times a day (BID) | RESPIRATORY_TRACT | Status: DC
  Administered 2011-06-04 – 2011-06-06 (×5): 2 via RESPIRATORY_TRACT
  Filled 2011-06-04: qty 6

## 2011-06-04 MED ORDER — LEVALBUTEROL HCL 0.63 MG/3ML IN NEBU
0.6300 mg | INHALATION_SOLUTION | Freq: Four times a day (QID) | RESPIRATORY_TRACT | Status: DC
  Administered 2011-06-04 – 2011-06-06 (×8): 0.63 mg via RESPIRATORY_TRACT
  Filled 2011-06-04 (×8): qty 3

## 2011-06-04 NOTE — Consult Note (Signed)
NAMEVOLNEY, REIERSON              ACCOUNT NO.:  [id number]  MEDICAL RECORD NO.:  [id number]  LOCATION:  IC03                          FACILITY:  [facility]  PHYSICIAN:  Purcell Nails, MD DATE OF BIRTH:  1940-12-21  DATE OF CONSULTATION: DATE OF DISCHARGE:                                CONSULTATION   REASON FOR CONSULT:  Type 2 diabetes.  HISTORY OF PRESENT ILLNESS:  This is a 71 year old gentleman with medical problems including type 2 diabetes for approximately 4-5 years, COPD, myocardial infarction.  The patient is currently inhouse after he was found to have shortness of breath.  He was subsequently diagnosed with congestive heart failure with elevated cardiac enzymes.  History of his diabetes has been back 4-5 years ago.  He was diagnosed with type 2 diabetes and has been treated with metformin 1000 mg p.o. b.i.d.  His wife in the room reports that he has not been testing his blood sugars, however, his A1c was found to be 7.2%.  Currently inhouse, he is on sliding scale insulin.  Glycemia has been near normal with no major fluctuations.  He did not require any significant duration of insulin therapy in the past.  PAST MEDICAL HISTORY:  COPD, type 2 diabetes, spinal disease, myocardial infarction.  SURGICAL HISTORY:  Back surgery.  FAMILY HISTORY:  Noncontributory.  SOCIAL HISTORY:  The patient is poor historian.  Former smoker.  No alcohol or drug use currently.  ALLERGIES:  CODEINE.  HOME MEDICATIONS:  Albuterol, aspirin, Lovenox, furosemide, ipratropium, moxifloxacin, nitroglycerin and metformin 1000 mg p.o. b.i.d. as well as Zocor 40 mg once a day.  REVIEW OF SYSTEMS:  At this point, the patient denies any hypoglycemia or fever or cough or chest pain.  However, he does have right-sided weakness thought to be secondary to stroke.  PHYSICAL EXAMINATION:  GENERAL:  He was found to be not in acute distress. VITAL SIGNS:  Blood pressure 106/61, pulse rate 96,  temperature 98.2. HEENT:  Moist mucous membranes.  No pallor, no icterus. NECK:  Negative for JVD. CHEST:  Significant for poor air entry at bases. CARDIOVASCULAR:  Distant heart sounds. ABDOMEN:  Slightly obese, otherwise soft and nontender.  Bowel sounds present. EXTREMITIES:  No edema. CNS:  Does not move the right lower extremity very well, however, he has got strong grip on both upper extremities.  His most recent lab work shows sodium 135, potassium 4.0, chloride 90, bicarb 26, BUN 18, creatinine 0.8, GFR greater than 90%.  His A1c on June 01, 2011 was 7.2%.  His inpatient glucose profile, reasonable trending from 150-211.  ASSESSMENT: 1. Type 2 diabetes, reasonably controlled with A1c of 7.2%. 2. Congestive heart failure. 3. Right lower extremity weakness. 4. Hypertension. 5. Deconditioning. 6. Chronic obstructive pulmonary disease. 7. Cervical spondylosis with radiculopathy.  PLAN:  The patient's insulin is readjusted such that he will need glargine 15 units at bedtime and we will continue with moderate dose sliding scale NovoLog to cover.  He would not be treated with oral antidiabetic medications in the hospital in view of possible need for imaging studies.  The patient's renal function is normal.  He may be resumed on his metformin on discharge.  I will be following him inhouse as well as an outpatient to continue to care for his diabetes as needed. Long-term and short-term complications of uncontrolled diabetes were discussed with the patient and his wife at the bedside.  Dear Dr. Juanetta Gosling, thank you for the opportunity to participate in the care of this pleasant patient.  I will update you on his progress.          ______________________________ Purcell Nails, MD     GN/MEDQ  D:  06/04/2011  T:  06/04/2011  Job:  [id number]

## 2011-06-04 NOTE — Consult Note (Signed)
Reason for Consult: Referring Physician:   [name] is an 71 y.o. male.  HPI:   Past Medical History  Diagnosis Date  . COPD (chronic obstructive pulmonary disease)   . Diabetes mellitus   . Spinal disease   . Myocardial infarct     Past Surgical History  Procedure Date  . Back surgery     Family History  Problem Relation Age of Onset  . COPD Father     Social History:  reports that he has been smoking.  He does not have any smokeless tobacco history on file. He reports that he does not drink alcohol or use illicit drugs.  Allergies:  Allergies  Allergen Reactions  . Codeine     Medications: Prior to Admission medications   Medication Sig Start Date End Date Taking? Authorizing Provider  ALPRAZolam Prudy Feeler) 0.5 MG tablet Take 0.5 mg by mouth 3 (three) times daily as needed. anxiety    Yes Historical Provider, MD  benazepril (LOTENSIN) 10 MG tablet Take 10 mg by mouth daily.     Yes Historical Provider, MD  meclizine (ANTIVERT) 25 MG tablet Take 25 mg by mouth 2 (two) times daily.     Yes Historical Provider, MD  metFORMIN (GLUCOPHAGE) 1000 MG tablet Take 1 tablet (1,000 mg total) by mouth 2 (two) times daily with a meal. 03/25/11 03/24/12 Yes Valera Castle, MD  metoprolol tartrate (LOPRESSOR) 25 MG tablet Take 25 mg by mouth 2 (two) times daily.     Yes Historical Provider, MD  oxyCODONE-acetaminophen (PERCOCET) 7.5-500 MG per tablet Take 1 tablet by mouth 3 (three) times daily as needed. pain    Yes Historical Provider, MD  sertraline (ZOLOFT) 100 MG tablet Take 100 mg by mouth daily.     Yes Historical Provider, MD  traMADol (ULTRAM) 50 MG tablet Take 100 mg by mouth 2 (two) times daily as needed. pain    Yes Historical Provider, MD  ZOCOR 40 MG tablet TAKE ONE TABLET DAILY. 04/10/11  Yes Valera Castle, MD    Scheduled Meds:   . aspirin EC  325 mg Oral Daily  . benazepril  10 mg Oral Daily  . budesonide-formoterol  2 puff Inhalation BID  . docusate sodium  100  mg Oral BID  . furosemide  40 mg Oral Daily  . heparin      . insulin aspart  0-15 Units Subcutaneous TID WC  . insulin glargine  15 Units Subcutaneous QHS  . levalbuterol  0.63 mg Nebulization Q6H  . meclizine  25 mg Oral BID  . moxifloxacin  400 mg Intravenous Q24H  . nitroGLYCERIN  1 inch Topical Q6H  . potassium chloride  10 mEq Intravenous Q1 Hr x 3  . sertraline  100 mg Oral Daily  . simvastatin  40 mg Oral q1800  . sodium chloride  3 mL Intravenous Q12H  . tiotropium  18 mcg Inhalation Daily  . DISCONTD: clopidogrel  75 mg Oral Q breakfast  . DISCONTD: furosemide  40 mg Intravenous Q12H  . DISCONTD: insulin aspart  0-5 Units Subcutaneous QHS  . DISCONTD: metoprolol tartrate  25 mg Oral BID   Continuous Infusions:   . DISCONTD: heparin 1,750 Units/hr (06/03/11 0834)   PRN Meds:.sodium chloride, acetaminophen, acetaminophen, ALPRAZolam, alum & mag hydroxide-simeth, morphine, nitroGLYCERIN, ondansetron (ZOFRAN) IV, ondansetron, oxyCODONE, polyethylene glycol, sodium chloride, traZODone, DISCONTD: levalbuterol   Results for orders placed during the hospital encounter of 06/01/11 (from the past 48 hour(s))  CARDIAC PANEL(CRET KIN+CKTOT+MB+TROPI)  Status: Abnormal   Collection Time   06/02/11 11:25 AM      Component Value Range Comment   Total CK 54  7 - 232 (U/L)    CK, MB 3.6  0.3 - 4.0 (ng/mL)    Troponin I 0.89 (*) <0.30 (ng/mL)    Relative Index RELATIVE INDEX IS INVALID  0.0 - 2.5    GLUCOSE, CAPILLARY     Status: Abnormal   Collection Time   06/02/11 11:45 AM      Component Value Range Comment   Glucose-Capillary 157 (*) 70 - 99 (mg/dL)   GLUCOSE, CAPILLARY     Status: Abnormal   Collection Time   06/02/11  4:29 PM      Component Value Range Comment   Glucose-Capillary 166 (*) 70 - 99 (mg/dL)    Comment 1 Notify RN      Comment 2 Documented in Chart     GLUCOSE, CAPILLARY     Status: Abnormal   Collection Time   06/02/11  9:30 PM      Component Value Range Comment    Glucose-Capillary 185 (*) 70 - 99 (mg/dL)    Comment 1 Documented in Chart      Comment 2 Notify RN     HEPARIN LEVEL (UNFRACTIONATED)     Status: Abnormal   Collection Time   06/03/11  4:19 AM      Component Value Range Comment   Heparin Unfractionated 0.17 (*) 0.30 - 0.70 (IU/mL)   CBC     Status: Abnormal   Collection Time   06/03/11  4:19 AM      Component Value Range Comment   WBC 11.0 (*) 4.0 - 10.5 (K/uL)    RBC 5.31  4.22 - 5.81 (MIL/uL)    Hemoglobin 16.7  13.0 - 17.0 (g/dL)    HCT 11.9  14.7 - 82.9 (%)    MCV 97.4  78.0 - 100.0 (fL)    MCH 31.5  26.0 - 34.0 (pg)    MCHC 32.3  30.0 - 36.0 (g/dL)    RDW 56.2 (*) 13.0 - 15.5 (%)    Platelets 168  150 - 400 (K/uL)   BASIC METABOLIC PANEL     Status: Abnormal   Collection Time   06/03/11  4:19 AM      Component Value Range Comment   Sodium 135  135 - 145 (mEq/L)    Potassium 3.2 (*) 3.5 - 5.1 (mEq/L)    Chloride 87 (*) 96 - 112 (mEq/L)    CO2 41 (*) 19 - 32 (mEq/L)    Glucose, Bld 147 (*) 70 - 99 (mg/dL)    BUN 13  6 - 23 (mg/dL)    Creatinine, Ser 8.65  0.50 - 1.35 (mg/dL)    Calcium 9.8  8.4 - 10.5 (mg/dL)    GFR calc non Af Amer >90  >90 (mL/min)    GFR calc Af Amer >90  >90 (mL/min)   BLOOD GAS, ARTERIAL     Status: Abnormal   Collection Time   06/03/11  6:46 AM      Component Value Range Comment   O2 Content 5.0      pH, Arterial 7.386  7.350 - 7.450     pCO2 arterial 64.0 (*) 35.0 - 45.0 (mmHg)    pO2, Arterial 67.4 (*) 80.0 - 100.0 (mmHg)    Bicarbonate 37.5 (*) 20.0 - 24.0 (mEq/L)    TCO2 31.6  0 - 100 (mmol/L)  Acid-Base Excess 12.0 (*) 0.0 - 2.0 (mmol/L)    O2 Saturation 92.4      Patient temperature 37.0      Collection site LEFT RADIAL      Drawn by COLLECTED BY RT      Sample type ARTERIAL      Allens test (pass/fail) PASS  PASS    GLUCOSE, CAPILLARY     Status: Abnormal   Collection Time   06/03/11  7:37 AM      Component Value Range Comment   Glucose-Capillary 192 (*) 70 - 99 (mg/dL)    Comment  1 Notify RN      Comment 2 Documented in Chart     GLUCOSE, CAPILLARY     Status: Abnormal   Collection Time   06/03/11 11:35 AM      Component Value Range Comment   Glucose-Capillary 194 (*) 70 - 99 (mg/dL)    Comment 1 Notify RN      Comment 2 Documented in Chart     HEPARIN LEVEL (UNFRACTIONATED)     Status: Normal   Collection Time   06/03/11  3:11 PM      Component Value Range Comment   Heparin Unfractionated 0.37  0.30 - 0.70 (IU/mL)   GLUCOSE, CAPILLARY     Status: Abnormal   Collection Time   06/03/11  4:42 PM      Component Value Range Comment   Glucose-Capillary 120 (*) 70 - 99 (mg/dL)    Comment 1 Notify RN      Comment 2 Documented in Chart     GLUCOSE, CAPILLARY     Status: Abnormal   Collection Time   06/03/11  9:23 PM      Component Value Range Comment   Glucose-Capillary 170 (*) 70 - 99 (mg/dL)    Comment 1 Notify RN     GLUCOSE, CAPILLARY     Status: Abnormal   Collection Time   06/04/11  7:46 AM      Component Value Range Comment   Glucose-Capillary 188 (*) 70 - 99 (mg/dL)    Comment 1 Notify RN      Comment 2 Documented in Chart     BLOOD GAS, ARTERIAL     Status: Abnormal   Collection Time   06/04/11  9:05 AM      Component Value Range Comment   O2 Content 6.0      pH, Arterial 7.356  7.350 - 7.450     pCO2 arterial 65.5 (*) 35.0 - 45.0 (mmHg)    pO2, Arterial 80.0  80.0 - 100.0 (mmHg)    Bicarbonate 35.7 (*) 20.0 - 24.0 (mEq/L)    TCO2 30.8  0 - 100 (mmol/L)    Acid-Base Excess 10.0 (*) 0.0 - 2.0 (mmol/L)    O2 Saturation 95.3      Patient temperature 37.0      Collection site LEFT RADIAL      Drawn by COLLECTED BY RT      Sample type ARTERIAL      Allens test (pass/fail) PASS  PASS      No results found.  Review of Systems  Constitutional: Positive for malaise/fatigue.  HENT: Positive for congestion.   Eyes: Negative.   Respiratory: Positive for cough and shortness of breath.   Cardiovascular: Positive for chest pain.  Gastrointestinal: Negative.    Genitourinary: Negative for dysuria.  Musculoskeletal: Positive for joint pain.  Neurological: Positive for focal weakness and weakness. Negative for headaches.  Endo/Heme/Allergies: Negative.   Psychiatric/Behavioral: Negative.    Blood pressure 106/61, pulse 102, temperature 98.2 F (36.8 C), temperature source Oral, resp. rate 13, height 6' (1.829 m), weight 89.3 kg (196 lb 13.9 oz), SpO2 93.00%. Physical Exam  Assessment/Plan: See dictation  [name] 06/04/2011, 9:56 AM

## 2011-06-04 NOTE — Progress Notes (Addendum)
SUBJECTIVE:Feels about the same.  Active Problems:  COPD  Pulmonary edema  CAD, UNSPECIFIED SITE  OBESITY  Cervical spondylosis with radiculopathy   LABS: Basic Metabolic Panel:  Basename 06/03/11 0419 06/02/11 0446  NA 135 140  K 3.2* 4.2  CL 87* 91*  CO2 41* 37*  GLUCOSE 147* 117*  BUN 13 12  CREATININE 0.67 0.67  CALCIUM 9.8 10.4  MG -- --  PHOS -- --   Liver Function Tests:  Basename 06/01/11 1505  AST 17  ALT 10  ALKPHOS 65  BILITOT 0.5  PROT 7.6  ALBUMIN 3.4*   CBC:  Basename 06/03/11 0419 06/02/11 0446  WBC 11.0* 9.0  NEUTROABS -- --  HGB 16.7 17.4*  HCT 51.7 54.1*  MCV 97.4 96.4  PLT 168 167   Cardiac Enzymes:  Basename 06/02/11 1125 06/02/11 0446 06/01/11 2042  CKTOTAL 54 63 82  CKMB 3.6 4.0 5.0*  CKMBINDEX -- -- --  TROPONINI 0.89* 1.17* 1.54*   Hemoglobin A1C:  Basename 06/01/11 1505  HGBA1C 7.2*   RADIOLOGY: IMPRESSION: 1. Chronic bronchitic type lung changes and mild cardiac enlargement. 2. Low lung volumes with vascular crowding and atelectasis.  ECHO 06/02/2010 Left ventricle: The cavity size was normal. Mild to moderate concentric hypertrophy. Systolic function was normal. The estimated ejection fraction was in the range of 50% to 55%. Severe hypokinesis to akinesis of the basal inferior myocardium. - Mitral valve: Moderately calcified annulus. - Right ventricle: The cavity size was normal. Wall thickness was mildly increased. - Right atrium: The atrium was mildly dilated. - Atrial septum: No defect or patent foramen ovale was identified.    PHYSICAL EXAM BP 108/63  Pulse 96  Temp(Src) 98.6 F (37 C) (Oral)  Resp 21  Ht 6' (1.829 m)  Wt 196 lb 13.9 oz (89.3 kg)  BMI 26.70 kg/m2  SpO2 91% General: Well developed, well nourished, in no acute distress Head: Eyes PERRLA, No xanthomas.   Normal cephalic and atramatic  Lungs: Upper lobe wheezes with some crackles, diminished in the bases with prolonged expiratory  phase. Heart: HRRR S1 S2, No MRG .  Pulses are 2+ & equal. No carotid bruit. No JVD.  No abdominal bruits. No femoral bruits. Abdomen: Bowel sounds are positive, abdomen soft and non-tender without masses or   Hernia's noted. Msk:  Back normal, normal gait. Normal strength and tone for age. Extremities: No clubbing, cyanosis or edema.  DP +1 Neuro: Alert and oriented X 3. Psych:  Flat affect, responds appropriately  TELEMETRY: Reviewed telemetry pt in Sinus tachycardia rates in the 100's  ASSESSMENT AND PLAN:  1. NSTEMI: There are new T-wave changes in the antero/lateral leads with mildly elevated troponin. Highest at 1.54 but are trending downward. He had been complaining of nonspecific bilateral arm pain. Echocardiogram demonstrates normal LV fx.  Heparin, plavix and BB have been d/c's as this does not appear to be related to ACS. With severely debilitatated condition related to spondylosis would be conservative in management with no invasive cardiac testing.   2. CHF: There was no evidence of CHF on CXR, lasix has been changed to PO by Dr. Dietrich Pates last evening., Admission BNP was elevated at 6273, Wt is up 6 lbs overnight, which I believe to be inaccurate. He is slightly tachycardic, may need to have addition of rate lowering medication for better cardiac output, consider adding digoxin. BP is low normal. Would prohibit CCB. Will check BMET this am.  3. COPD: This appears to be more of  a pulmonary issue concerning his breathing status. This is followed by Dr. Juanetta Gosling with inhalers, and antibiotics.   Bettey Mare. Lyman Bishop NP [facility] 06/04/2011, 9:03 AM  Attending note:  Patient seen and examined. Reviewed history and database recorded by Ms. Lyman Bishop, recent consultation with Dr. Dietrich Pates. He complains mainly of cough and chest congestion, no active chest pain. Neurology consultation is pending.  On examination he is awake and alert, systolic blood pressure ranging from 91-123,  heart rate in the 90s to 100s with sinus rhythm by telemetry. Lungs exhibit diminished breath sounds with end expiratory wheezes, cardiac exam without S3, no pitting edema.  Lab work reviewed with potassium 4.0, BUN 18, creatinine 0.8, pro BNP 233, peak troponin I 1.54 that is trending down. ECG also reviewed showing sinus rhythm with anterolateral T wave inversions and PVCs. Chest x-ray demonstrated bronchitic changes with atelectasis.  Suspect type II NSTEMI. Planning medical therapy and observation for now. He is on aspirin, Lotensin, Lasix, nitroglycerin paste, and Zocor. Beta blocker was discontinued by Dr. Dietrich Pates. Plan to stop nitroglycerin paste, initiate low-dose Cardizem for heart rate control with hold parameters. Also needs DVT prophylaxis. His LVEF was overall preserved by recent echocardiogram evidence of prior inferior infarct.  Jonelle Sidle, M.D., F.A.C.C.

## 2011-06-04 NOTE — Progress Notes (Signed)
*  PRELIMINARY RESULTS* EEG has been performed.  [name] 06/04/2011, 12:26 PM

## 2011-06-04 NOTE — Progress Notes (Signed)
[name], [name]              ACCOUNT NO.:  [id number]  MEDICAL RECORD NO.:  [id number]  LOCATION:  IC03                          FACILITY:  [facility]  PHYSICIAN:  Wilbert Hayashi G. Renard Matter, MD   DATE OF BIRTH:  12/23/1940  DATE OF PROCEDURE: DATE OF DISCHARGE:                                PROGRESS NOTE   This patient continues to have cough, chest congestion, and dyspnea.  He has seen by Cardiology.  Of note, was minor T-wave abnormalities.  2D echo showed normal left ventricular function.  He has had nonspecific bilateral arm pain, does have a spinal stenosis history, and has been severely debilitated because of this.  No further invasive cardiac tests were recommended at this particular time.  He has also been seen by Dr. Juanetta Gosling with reference to his COPD.  He plans to start him on Spiriva and Symbicort, and treat his COPD more intensively.  OBJECTIVE:  VITAL SIGNS:  Blood pressure 107/64, respirations 21, pulse 103, temperature 98.6. LUNGS:  Diminished breath sounds, occasional rhonchus heard. HEART:  Regular rhythm. ABDOMEN:  No palpable organs or masses.  ASSESSMENT:  The patient does have chronic obstructive pulmonary disease, diabetes mellitus, spinal stenosis, previous myocardial infarction.  PLAN:  To treat his COPD more aggressively, but not pursue any further cardiac tests.  The patient will be seen by Neurology.     Emili Mcloughlin G. Renard Matter, MD     AGM/MEDQ  D:  06/04/2011  T:  06/04/2011  Job:  [id number]

## 2011-06-04 NOTE — Consult Note (Signed)
  [id number] 

## 2011-06-04 NOTE — Progress Notes (Signed)
Subjective: He says he feels about the same. I discussed his situation with his wife. She is requesting neurology and endocrinology consultation. She did not have a chance to speak to Dr. Renard Matter about this this morning saw go ahead and order those. Otherwise he denies any chest pain. He is still somewhat short of breath.  Objective: Vital signs in last 24 hours: Temp:  [97.7 F (36.5 C)-98.6 F (37 C)] 98.6 F (37 C) (01/08 0400) Pulse Rate:  [69-98] 96  (01/08 0400) Resp:  [17-25] 21  (01/08 0600) BP: (91-130)/(41-98) 107/59 mmHg (01/08 0600) SpO2:  [90 %-99 %] 90 % (01/08 0400) Weight:  [89.3 kg (196 lb 13.9 oz)] 196 lb 13.9 oz (89.3 kg) (01/08 0500) Weight change: 3 kg (6 lb 9.8 oz) Last BM Date: 06/01/11  Intake/Output from previous day: 01/07 0701 - 01/08 0700 In: 2016.5 [P.O.:1080; I.V.:382.5; IV Piggyback:554] Out: 1250 [Urine:1250]  PHYSICAL EXAM General appearance: alert, cooperative, mild distress and slowed mentation Resp: rhonchi bilaterally Cardio: regular rate and rhythm, S1, S2 normal, no murmur, click, rub or gallop GI: soft, non-tender; bowel sounds normal; no masses,  no organomegaly Extremities: Chronic changes from his spinal stenosis  Lab Results:    Basic Metabolic Panel:  Basename 06/03/11 0419 06/02/11 0446  NA 135 140  K 3.2* 4.2  CL 87* 91*  CO2 41* 37*  GLUCOSE 147* 117*  BUN 13 12  CREATININE 0.67 0.67  CALCIUM 9.8 10.4  MG -- --  PHOS -- --   Liver Function Tests:  Basename 06/01/11 1505  AST 17  ALT 10  ALKPHOS 65  BILITOT 0.5  PROT 7.6  ALBUMIN 3.4*   No results found for this basename: LIPASE:2,AMYLASE:2 in the last 72 hours No results found for this basename: AMMONIA:2 in the last 72 hours CBC:  Basename 06/03/11 0419 06/02/11 0446  WBC 11.0* 9.0  NEUTROABS -- --  HGB 16.7 17.4*  HCT 51.7 54.1*  MCV 97.4 96.4  PLT 168 167   Cardiac Enzymes:  Basename 06/02/11 1125 06/02/11 0446 06/01/11 2042  CKTOTAL 54 63 82    CKMB 3.6 4.0 5.0*  CKMBINDEX -- -- --  TROPONINI 0.89* 1.17* 1.54*   BNP:  Basename 06/01/11 1505  PROBNP 6273.0*   D-Dimer: No results found for this basename: DDIMER:2 in the last 72 hours CBG:  Basename 06/03/11 2123 06/03/11 1642 06/03/11 1135 06/03/11 0737 06/02/11 2130 06/02/11 1629  GLUCAP 170* 120* 194* 192* 185* 166*   Hemoglobin A1C:  Basename 06/01/11 1505  HGBA1C 7.2*   Fasting Lipid Panel: No results found for this basename: CHOL,HDL,LDLCALC,TRIG,CHOLHDL,LDLDIRECT in the last 72 hours Thyroid Function Tests: No results found for this basename: TSH,T4TOTAL,FREET4,T3FREE,THYROIDAB in the last 72 hours Anemia Panel: No results found for this basename: VITAMINB12,FOLATE,FERRITIN,TIBC,IRON,RETICCTPCT in the last 72 hours Coagulation:  Basename 06/01/11 1600  LABPROT 14.5  INR 1.11   Urine Drug Screen: Drugs of Abuse  No results found for this basename: labopia, cocainscrnur, labbenz, amphetmu, thcu, labbarb    Alcohol Level: No results found for this basename: ETH:2 in the last 72 hours Urinalysis:  Misc. Labs:  ABGS  Basename 06/03/11 0646  PHART 7.386  PO2ART 67.4*  TCO2 31.6  HCO3 37.5*   CULTURES Recent Results (from the past 240 hour(s))  MRSA PCR SCREENING     Status: Normal   Collection Time   06/01/11  8:04 PM      Component Value Range Status Comment   MRSA by PCR NEGATIVE  NEGATIVE  Final    Studies/Results: No results found.  Medications:  Prior to Admission:  Prescriptions prior to admission  Medication Sig Dispense Refill  . ALPRAZolam (XANAX) 0.5 MG tablet Take 0.5 mg by mouth 3 (three) times daily as needed. anxiety       . benazepril (LOTENSIN) 10 MG tablet Take 10 mg by mouth daily.        . meclizine (ANTIVERT) 25 MG tablet Take 25 mg by mouth 2 (two) times daily.        . metFORMIN (GLUCOPHAGE) 1000 MG tablet Take 1 tablet (1,000 mg total) by mouth 2 (two) times daily with a meal.  90 tablet  3  . metoprolol tartrate  (LOPRESSOR) 25 MG tablet Take 25 mg by mouth 2 (two) times daily.        Marland Kitchen oxyCODONE-acetaminophen (PERCOCET) 7.5-500 MG per tablet Take 1 tablet by mouth 3 (three) times daily as needed. pain       . sertraline (ZOLOFT) 100 MG tablet Take 100 mg by mouth daily.        . traMADol (ULTRAM) 50 MG tablet Take 100 mg by mouth 2 (two) times daily as needed. pain       . ZOCOR 40 MG tablet TAKE ONE TABLET DAILY.  30 each  5   Scheduled:   . aspirin EC  325 mg Oral Daily  . benazepril  10 mg Oral Daily  . budesonide-formoterol  2 puff Inhalation BID  . docusate sodium  100 mg Oral BID  . furosemide  40 mg Oral Daily  . heparin      . insulin aspart  0-15 Units Subcutaneous TID WC  . insulin aspart  0-5 Units Subcutaneous QHS  . levalbuterol  0.63 mg Nebulization Q6H  . meclizine  25 mg Oral BID  . moxifloxacin  400 mg Intravenous Q24H  . nitroGLYCERIN  1 inch Topical Q6H  . potassium chloride  10 mEq Intravenous Q1 Hr x 3  . sertraline  100 mg Oral Daily  . simvastatin  40 mg Oral q1800  . sodium chloride  3 mL Intravenous Q12H  . tiotropium  18 mcg Inhalation Daily  . DISCONTD: clopidogrel  75 mg Oral Q breakfast  . DISCONTD: furosemide  40 mg Intravenous Q12H  . DISCONTD: metoprolol tartrate  25 mg Oral BID   Continuous:   . DISCONTD: heparin 1,750 Units/hr (06/03/11 0834)   EAV:WUJWJX chloride, acetaminophen, acetaminophen, ALPRAZolam, alum & mag hydroxide-simeth, morphine, nitroGLYCERIN, ondansetron (ZOFRAN) IV, ondansetron, oxyCODONE, polyethylene glycol, sodium chloride, traZODone, DISCONTD: levalbuterol  Assesment: Now that his heart disease appears not to be as prominent I'm going to go ahead with treatment for his COPD more intensively. Active Problems:  OBESITY  CAD, UNSPECIFIED SITE  COPD  Pulmonary edema  Cervical spondylosis with radiculopathy    Plan: He will start Spiriva and Symbicort and I will go ahead and ask for consultations as noted    LOS: 3 days    Livingston Denner L 06/04/2011, 8:02 AM

## 2011-06-05 ENCOUNTER — Other Ambulatory Visit: Payer: Self-pay

## 2011-06-05 DIAGNOSIS — J9610 Chronic respiratory failure, unspecified whether with hypoxia or hypercapnia: Secondary | ICD-10-CM

## 2011-06-05 DIAGNOSIS — I214 Non-ST elevation (NSTEMI) myocardial infarction: Secondary | ICD-10-CM

## 2011-06-05 DIAGNOSIS — I2510 Atherosclerotic heart disease of native coronary artery without angina pectoris: Secondary | ICD-10-CM

## 2011-06-05 LAB — CBC
HCT: 46.9 % (ref 39.0–52.0)
Hemoglobin: 14.8 g/dL (ref 13.0–17.0)
MCH: 30.5 pg (ref 26.0–34.0)
MCV: 96.5 fL (ref 78.0–100.0)
RBC: 4.86 MIL/uL (ref 4.22–5.81)

## 2011-06-05 LAB — GLUCOSE, CAPILLARY
Glucose-Capillary: 258 mg/dL — ABNORMAL HIGH (ref 70–99)
Glucose-Capillary: 161 mg/dL — ABNORMAL HIGH (ref 70–99)

## 2011-06-05 MED ORDER — INSULIN GLARGINE 100 UNIT/ML ~~LOC~~ SOLN
20.0000 [IU] | Freq: Every day | SUBCUTANEOUS | Status: DC
  Administered 2011-06-05: 20 [IU] via SUBCUTANEOUS

## 2011-06-05 MED ORDER — POLYETHYLENE GLYCOL 3350 17 G PO PACK
17.0000 g | PACK | Freq: Every day | ORAL | Status: DC
  Administered 2011-06-05 – 2011-06-06 (×2): 17 g via ORAL

## 2011-06-05 MED ORDER — DILTIAZEM HCL 60 MG PO TABS
60.0000 mg | ORAL_TABLET | Freq: Three times a day (TID) | ORAL | Status: DC
  Administered 2011-06-05 – 2011-06-06 (×3): 60 mg via ORAL
  Filled 2011-06-05 (×3): qty 1

## 2011-06-05 MED ORDER — SODIUM CHLORIDE 0.9 % IJ SOLN
INTRAMUSCULAR | Status: AC
  Filled 2011-06-05: qty 3

## 2011-06-05 NOTE — Consult Note (Signed)
NAMESOUL, HACKMAN              ACCOUNT NO.:  [id number]  MEDICAL RECORD NO.:  [id number]  LOCATION:  IC03                          FACILITY:  [facility]  PHYSICIAN:  Lorelle Macaluso A. Gerilyn Pilgrim, M.D. DATE OF BIRTH:  1940-10-02  DATE OF CONSULTATION: DATE OF DISCHARGE:                                CONSULTATION   REASON FOR CONSULTATION:  Abnormal movements and dropping things.  HISTORY:  The patient is a 71 year old man who presented by 2-years history of what appears to be negative myoclonus or even myoclonic activity involving the upper extremities.  The morning seemed to be more severe.  He reports that he has these essentially on a daily basis, again typical in the morning time, it is unclear.  It has gotten worse, but the patient wanted to get this evaluated.  PHYSICAL EXAMINATION:  GENERAL:  An obese man, in no acute distress.  He is on oxygen because of acute shortness of breath and have an MI, but does not typically uses oxygen at home.  He does know not use any bronchodilators. HEENT:  Neck is supple.  Head is normocephalic, atraumatic.  ABDOMEN: Obese, but soft. EXTREMITIES:  No significant edema. MENTATION:  He is awake and alert.  He is lucid and coherent.  Speech, language, and cognition are intact. CRANIAL NERVE EVALUATION:  Shows that pupils are equal, round, reactive to light and accommodation.  Extraocular movements are intact.  Visual fields are full.  Facial muscle strength is symmetric.  Tongue is midline.  Uvula midline.  Shoulder shrug is normal.  Motor examination shows right leg weakness.  The patient reported that he has severe pain involving the right hip from severe osteoarthritis and has not ambulated for 6 months due to this.  The strength is graded 2/5, stronger with dorsiflexion, however, above 4.  Other extremities show normal tone, bulk, and strength.  Reflexes are diminished along the right knee, otherwise 2+.  Plantars are both equivocal.  Coordination:   He does seem to have some flattening/asterixis, right upper extremity.  No myoclonic activity.  There is occasional histological high-frequency, low- amplitude tremors noted bilaterally.  No cogwheeling.  No bradykinesia. Sensation, responsive painful to stimuli bilaterally.  ASSESSMENT:  Abnormal movements, unclear if these are myoclonic-type activity or negative myoclonus/asterixis.  These are typically caused by toxic metabolic processes, rarely this myoclonus can be caused by seizure.  We will do an EEG.  If the patient thinks these need to be treated, then we may want to try a low dose Klonopin 0.5 mg b.i.d.     Khaliyah Northrop A. Gerilyn Pilgrim, M.D.     KAD/MEDQ  D:  06/04/2011  T:  06/05/2011  Job:  [id number]

## 2011-06-05 NOTE — Progress Notes (Signed)
Patient ID: [name], male   DOB: [birth year], 71 y.o.   MRN: [id number]

## 2011-06-05 NOTE — Progress Notes (Signed)
Approximately 0400-Pt had a significant nose bleed with 3 quarter sized clots and approximately 100 cc blood loss. Called Dr. Felecia Shelling with no response back. Called RT and he was changed from nasal canula to aerosol mask (10L).

## 2011-06-05 NOTE — Progress Notes (Signed)
[name], [name]              ACCOUNT NO.:  [id number]  MEDICAL RECORD NO.:  [id number]  LOCATION:  IC03                          FACILITY:  [facility]  PHYSICIAN:  Mackson Botz A. Gerilyn Pilgrim, M.D. DATE OF BIRTH:  February 20, 1941  DATE OF PROCEDURE: DATE OF DISCHARGE:                                PROGRESS NOTE   No new complaints are reported.  The patient continues to have episodes of what appears to be negative myoclonus, if not myoclonic activity, hard to determine.  He is awake and alert.  He is lucid and coherent. Speech, language, and cognition are intact.  PHYSICAL EXAMINATION:  HEENT:  Head is normocephalic and atraumatic. NECK:  Supple. NEUROLOGIC:  Pupils are reactive.  Extraocular movements are full. Facial muscle strength is symmetric.  Motor examination shows good strength throughout.  I see no tremors today.  No flapping or asterixis noted.  EEG is essentially unremarkable.  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  This is usually a toxic metabolic phenomenon either medication effect or other metabolic derangement.  If the symptoms are problematic to the patient, one may consider low dose clonazepam 0.5 mg b.i.d. or 0.25 mg b.i.d.     Shelba Susi A. Gerilyn Pilgrim, M.D.     KAD/MEDQ  D:  06/05/2011  T:  06/05/2011  Job:  [id number]

## 2011-06-05 NOTE — Progress Notes (Signed)
Subjective: Interval History:   Objective: Vital signs in last 24 hours: Temp:  [98.3 F (36.8 C)-100 F (37.8 C)] 98.3 F (36.8 C) (01/09 0400) Pulse Rate:  [97-109] 100  (01/09 0700) Resp:  [13-25] 23  (01/09 0700) BP: (103-132)/(52-86) 126/63 mmHg (01/09 0700) SpO2:  [85 %-96 %] 95 % (01/09 0855) FiO2 (%):  [40 %] 40 % (01/09 0855) Weight:  [87.9 kg (193 lb 12.6 oz)] 87.9 kg (193 lb 12.6 oz) (01/09 0400)  Intake/Output from previous day: 01/08 0701 - 01/09 0700 In: 910 [P.O.:660; IV Piggyback:250] Out: 2050 [Urine:2050] Intake/Output this shift:   Nutritional status: Carb Control    Lab Results:  Basename 06/05/11 0436 06/04/11 0948 06/03/11 0419  WBC 8.9 -- 11.0*  HGB 14.8 -- 16.7  HCT 46.9 -- 51.7  PLT 167 -- 168  NA -- 135 135  K -- 4.0 3.2*  CL -- 90* 87*  CO2 -- 36* 41*  GLUCOSE -- 301* 147*  BUN -- 18 13  CREATININE -- 0.81 0.67  CALCIUM -- 9.0 9.8  LABA1C -- -- --   Lipid Panel No results found for this basename: CHOL,TRIG,HDL,CHOLHDL,VLDL,LDLCALC in the last 72 hours  Studies/Results: No results found.  Medications:  Scheduled Meds:   . aspirin EC  325 mg Oral Daily  . benazepril  10 mg Oral Daily  . budesonide-formoterol  2 puff Inhalation BID  . diltiazem  30 mg Oral Q8H  . docusate sodium  100 mg Oral BID  . enoxaparin (LOVENOX) injection  40 mg Subcutaneous Daily  . furosemide  40 mg Oral Daily  . insulin aspart  0-15 Units Subcutaneous TID WC  . insulin glargine  20 Units Subcutaneous QHS  . levalbuterol  0.63 mg Nebulization Q6H  . meclizine  25 mg Oral BID  . moxifloxacin  400 mg Intravenous Q24H  . nitroGLYCERIN  1 inch Topical Q6H  . rosuvastatin  10 mg Oral q1800  . sertraline  100 mg Oral Daily  . sodium chloride  3 mL Intravenous Q12H  . sodium chloride      . tiotropium  18 mcg Inhalation Daily  . DISCONTD: insulin glargine  15 Units Subcutaneous QHS  . DISCONTD: simvastatin  40 mg Oral q1800   Continuous Infusions:    PRN Meds:.sodium chloride, acetaminophen, acetaminophen, ALPRAZolam, alum & mag hydroxide-simeth, morphine, nitroGLYCERIN, ondansetron (ZOFRAN) IV, ondansetron, oxyCODONE, polyethylene glycol, sodium chloride, traZODone   Assessment/Plan: SEE DICTATION   LOS: 4 days   [name]

## 2011-06-05 NOTE — Progress Notes (Signed)
Subjective: He is overall about the same. He is less short of breath. I think the changes in his medications at help some. He has no new complaints. His wife is wondering about whether he needs oxygen at home and when he will be able to be discharged  Objective: Vital signs in last 24 hours: Temp:  [98.2 F (36.8 C)-100 F (37.8 C)] 98.3 F (36.8 C) (01/09 0400) Pulse Rate:  [96-109] 100  (01/09 0700) Resp:  [13-25] 23  (01/09 0700) BP: (103-132)/(52-86) 126/63 mmHg (01/09 0700) SpO2:  [85 %-96 %] 94 % (01/09 0700) Weight:  [87.9 kg (193 lb 12.6 oz)] 87.9 kg (193 lb 12.6 oz) (01/09 0400) Weight change: -1.4 kg (-3 lb 1.4 oz) Last BM Date: 06/01/11  Intake/Output from previous day: 01/08 0701 - 01/09 0700 In: 910 [P.O.:660; IV Piggyback:250] Out: 2050 [Urine:2050]  PHYSICAL EXAM General appearance: alert, cooperative and mild distress Resp: rhonchi bilaterally Cardio: regular rate and rhythm, S1, S2 normal, no murmur, click, rub or gallop GI: soft, non-tender; bowel sounds normal; no masses,  no organomegaly Extremities: Chronic changes from spinal stenosis  Lab Results:    Basic Metabolic Panel:  Basename 06/04/11 0948 06/03/11 0419  NA 135 135  K 4.0 3.2*  CL 90* 87*  CO2 36* 41*  GLUCOSE 301* 147*  BUN 18 13  CREATININE 0.81 0.67  CALCIUM 9.0 9.8  MG -- --  PHOS -- --   Liver Function Tests: No results found for this basename: AST:2,ALT:2,ALKPHOS:2,BILITOT:2,PROT:2,ALBUMIN:2 in the last 72 hours No results found for this basename: LIPASE:2,AMYLASE:2 in the last 72 hours No results found for this basename: AMMONIA:2 in the last 72 hours CBC:  Basename 06/05/11 0436 06/03/11 0419  WBC 8.9 11.0*  NEUTROABS -- --  HGB 14.8 16.7  HCT 46.9 51.7  MCV 96.5 97.4  PLT 167 168   Cardiac Enzymes:  Basename 06/02/11 1125  CKTOTAL 54  CKMB 3.6  CKMBINDEX --  TROPONINI 0.89*   BNP:  Basename 06/04/11 0951  PROBNP 233.3*   D-Dimer: No results found for  this basename: DDIMER:2 in the last 72 hours CBG:  Basename 06/04/11 2058 06/04/11 1628 06/04/11 1148 06/04/11 0746 06/03/11 2123 06/03/11 1642  GLUCAP 125* 147* 211* 188* 170* 120*   Hemoglobin A1C: No results found for this basename: HGBA1C in the last 72 hours Fasting Lipid Panel: No results found for this basename: CHOL,HDL,LDLCALC,TRIG,CHOLHDL,LDLDIRECT in the last 72 hours Thyroid Function Tests: No results found for this basename: TSH,T4TOTAL,FREET4,T3FREE,THYROIDAB in the last 72 hours Anemia Panel: No results found for this basename: VITAMINB12,FOLATE,FERRITIN,TIBC,IRON,RETICCTPCT in the last 72 hours Coagulation: No results found for this basename: LABPROT:2,INR:2 in the last 72 hours Urine Drug Screen: Drugs of Abuse  No results found for this basename: labopia, cocainscrnur, labbenz, amphetmu, thcu, labbarb    Alcohol Level: No results found for this basename: ETH:2 in the last 72 hours Urinalysis:  Misc. Labs:  ABGS  Basename 06/04/11 0905  PHART 7.356  PO2ART 80.0  TCO2 30.8  HCO3 35.7*   CULTURES Recent Results (from the past 240 hour(s))  MRSA PCR SCREENING     Status: Normal   Collection Time   06/01/11  8:04 PM      Component Value Range Status Comment   MRSA by PCR NEGATIVE  NEGATIVE  Final    Studies/Results: No results found.  Medications:  Scheduled:   . aspirin EC  325 mg Oral Daily  . benazepril  10 mg Oral Daily  . budesonide-formoterol  2 puff Inhalation BID  . diltiazem  30 mg Oral Q8H  . docusate sodium  100 mg Oral BID  . enoxaparin (LOVENOX) injection  40 mg Subcutaneous Daily  . furosemide  40 mg Oral Daily  . insulin aspart  0-15 Units Subcutaneous TID WC  . insulin glargine  15 Units Subcutaneous QHS  . levalbuterol  0.63 mg Nebulization Q6H  . meclizine  25 mg Oral BID  . moxifloxacin  400 mg Intravenous Q24H  . nitroGLYCERIN  1 inch Topical Q6H  . rosuvastatin  10 mg Oral q1800  . sertraline  100 mg Oral Daily  . sodium  chloride  3 mL Intravenous Q12H  . sodium chloride      . tiotropium  18 mcg Inhalation Daily  . DISCONTD: insulin aspart  0-5 Units Subcutaneous QHS  . DISCONTD: simvastatin  40 mg Oral q1800   Continuous:  ZOX:WRUEAV chloride, acetaminophen, acetaminophen, ALPRAZolam, alum & mag hydroxide-simeth, morphine, nitroGLYCERIN, ondansetron (ZOFRAN) IV, ondansetron, oxyCODONE, polyethylene glycol, sodium chloride, traZODone, DISCONTD: levalbuterol  Assesment: He has severe COPD. He had elevated cardiac enzymes. He has known coronary artery occlusive disease. He seems to have improved. I do think he'll need oxygen at home. Active Problems:  OBESITY  Coronary atherosclerosis of native coronary artery  COPD  Pulmonary edema  Cervical spondylosis with radiculopathy  Acute myocardial infarction, subendocardial infarction    Plan: Continue current treatments including inhaled bronchodilators etc. He had a nosebleed so he is now on mask O2.    LOS: 4 days   Samia Kukla L 06/05/2011, 7:54 AM

## 2011-06-05 NOTE — Progress Notes (Signed)
NAMEDAMAR, PETIT              ACCOUNT NO.:  [id number]  MEDICAL RECORD NO.:  [id number]  LOCATION:  IC03                          FACILITY:  [facility]  PHYSICIAN:  Purcell Nails, MD DATE OF BIRTH:  04-02-1941  DATE OF PROCEDURE:  06/05/2011 DATE OF DISCHARGE:                                PROGRESS NOTE   CHIEF COMPLAINT:  Followup for type 2 diabetes.  SUBJECTIVE:  The patient is eating at least more than 50% of his meal. No hypoglycemia.  Blood sugars are controlled in near normal range.  OBJECTIVE:  VITAL SIGNS:  He is on nonrebreather.  Blood pressure 126/63, pulse rate 100, temperature 98.3. HEENT:  No JVD.  No thyromegaly. CHEST:  Significant for rales at bilateral bases. CARDIOVASCULAR:  Distant heart sounds. ABDOMEN:  Soft, nontender.  Bowel sounds present. EXTREMITIES:  No edema.  LABORATORY DATA:  Blood glucose ranging from 125-200.  ASSESSMENT:  Type 2 diabetes with recent A1c of 7.2%.  PLAN:  We will continue on exclusive insulin therapy using Lantus, which will be increased to 20 units at bedtime and moderate dose NovoLog sliding scale to control unexpected hyperglycemia.  As stated before, this patient still qualifies for oral insulin sensitizers on discharge, however, not in the hospital.          ______________________________ Purcell Nails, MD     GN/MEDQ  D:  06/05/2011  T:  06/05/2011  Job:  [id number]

## 2011-06-05 NOTE — Progress Notes (Addendum)
SUBJECTIVE:Feels better and wants to go home. Brighter and more alert. Active Problems:  COPD  Pulmonary edema  Coronary atherosclerosis of native coronary artery  OBESITY  Cervical spondylosis with radiculopathy  Acute myocardial infarction, subendocardial infarction   LABS: Basic Metabolic Panel:  Basename 06/04/11 0948 06/03/11 0419  NA 135 135  K 4.0 3.2*  CL 90* 87*  CO2 36* 41*  GLUCOSE 301* 147*  BUN 18 13  CREATININE 0.81 0.67  CALCIUM 9.0 9.8  MG -- --  PHOS -- --   Liver Function Tests: No results found for this basename: AST:2,ALT:2,ALKPHOS:2,BILITOT:2,PROT:2,ALBUMIN:2 in the last 72 hours No results found for this basename: LIPASE:2,AMYLASE:2 in the last 72 hours CBC:  Basename 06/05/11 0436 06/03/11 0419  WBC 8.9 11.0*  NEUTROABS -- --  HGB 14.8 16.7  HCT 46.9 51.7  MCV 96.5 97.4  PLT 167 168   Cardiac Enzymes:  Basename 06/02/11 1125  CKTOTAL 54  CKMB 3.6  CKMBINDEX --  TROPONINI 0.89*   RADIOLOGY: IMPRESSION: 1. Chronic bronchitic type lung changes and mild cardiac enlargement. 2. Low lung volumes with vascular crowding and atelectasis.  PHYSICAL EXAM BP 126/63  Pulse 100  Temp(Src) 98.3 F (36.8 C) (Oral)  Resp 23  Ht 6' (1.829 m)  Wt 193 lb 12.6 oz (87.9 kg)  BMI 26.28 kg/m2  SpO2 95% General: Well developed, well nourished, in no acute distress Head: Eyes PERRLA, No xanthomas.   Normal cephalic and atramatic  Lungs: Clear bilaterally to auscultation and percussion. Heart: HRRR S1 S2, No MRG .  Pulses are 2+ & equal.            No carotid bruit. No JVD.  No abdominal bruits. No femoral bruits. Abdomen: Bowel sounds are positive, abdomen soft and non-tender without masses or                  Hernia's noted. Msk:  Back normal, normal gait. Normal strength and tone for age. Extremities: No clubbing, cyanosis or edema.  DP +1 Neuro: Alert and oriented X 3. Psych:  Good affect, responds appropriately  TELEMETRY: Reviewed telemetry  pt in ST. Rates 100-110.  ASSESSMENT AND PLAN:  1. CHF: He has significantly improved breathing status with clear lungs no cough no evidence of fluid overload. Would continue PO Lasix as he is taking as inpatient on out regimen. Heart rate is up into the 100s today and noted overnight as well. He is currently on Cardizem 30 mg every 8 hours. Can consider increasing and changing to long-acting Cardizem 180 mg daily-240 mg daily blood pressure is stable.he will need outpatient followup with cardiology on discharge.   2.NSTEMI: Type ll.: This is related to hypoxia in the setting of CHF and respiratory distress. Concerns for narcotic induced respiratory depression.. He has anti-anxiety meds and narcotics have been reduced during this hospitalization and he has been more alert and breathing much better as well along with medical management of COPD and heart failure. No acute EKG changes are noted. No invasive cardiac workup is planned. Medical management only. Will DC nitroglycerin paste.     Bettey Mare. Lyman Bishop NP [facility] 06/05/2011, 10:06 AM   Attending note:  Patient seen and examined. Reviewed database as recorded by Ms. Lawrence. More interactive today. Still some cough, no active chest pain.  On examination heart rate control not yet optimal, blood pressure 126/63. Continues with diminished breath sounds and end expiratory wheezes, no S3 or pitting edema.  Followup lab work  shows hemoglobin 14.8, platelets 167.  Type II NSTEMI. Planning medical therapy and observation for now. He is on aspirin, Lotensin, Cardizem, Lasix, and Crestor, Lovenox for DVT prophylaxis. His LVEF was overall preserved by recent echocardiogram evidence of prior inferior infarct. Anticipate conversion of Cardizem to CD formulation for discharge.  Jonelle Sidle, M.D., F.A.C.C.

## 2011-06-06 LAB — GLUCOSE, CAPILLARY

## 2011-06-06 MED ORDER — INSULIN GLARGINE 100 UNIT/ML ~~LOC~~ SOLN: 20.0000 [IU] | Freq: Every day | SUBCUTANEOUS | Status: DC

## 2011-06-06 MED ORDER — ROSUVASTATIN CALCIUM 10 MG PO TABS: 10.0000 mg | ORAL_TABLET | Freq: Every day | ORAL | Status: DC

## 2011-06-06 MED ORDER — LEVALBUTEROL HCL 0.63 MG/3ML IN NEBU: 0.6300 mg | INHALATION_SOLUTION | Freq: Three times a day (TID) | RESPIRATORY_TRACT | Status: DC

## 2011-06-06 MED ORDER — DSS 100 MG PO CAPS: 100.0000 mg | ORAL_CAPSULE | Freq: Two times a day (BID) | ORAL | Status: AC

## 2011-06-06 MED ORDER — LEVALBUTEROL HCL 0.63 MG/3ML IN NEBU
0.6300 mg | INHALATION_SOLUTION | Freq: Three times a day (TID) | RESPIRATORY_TRACT | Status: DC
  Administered 2011-06-06 (×2): 0.63 mg via RESPIRATORY_TRACT
  Filled 2011-06-06 (×2): qty 3

## 2011-06-06 MED ORDER — BUDESONIDE-FORMOTEROL FUMARATE 160-4.5 MCG/ACT IN AERO: 2.0000 | INHALATION_SPRAY | Freq: Two times a day (BID) | RESPIRATORY_TRACT | Status: DC

## 2011-06-06 MED ORDER — TIOTROPIUM BROMIDE MONOHYDRATE 18 MCG IN CAPS: 18.0000 ug | ORAL_CAPSULE | Freq: Every day | RESPIRATORY_TRACT | Status: DC

## 2011-06-06 NOTE — Discharge Summary (Signed)
[name], [name]              ACCOUNT NO.:  [id number]  MEDICAL RECORD NO.:  [id number]  LOCATION:  IC03                          FACILITY:  [facility]  PHYSICIAN:  Kaleena Corrow G. Renard Matter, MD   DATE OF BIRTH:  Apr 03, 1941  DATE OF ADMISSION:  06/01/2011 DATE OF DISCHARGE:  01/10/2013LH                              DISCHARGE SUMMARY   DIAGNOSES: 1. Chronic obstructive pulmonary disease. 2. Coronary artery disease with subendocardial myocardial infarction. 3. Congestive heart failure. 4. Pulmonary edema. 5. Diabetes mellitus type 2. 6. Spinal stenosis. 7. Myoclonus/asterixis. 8. Cervical spondylosis.  CONDITION:  Stable and improved at the time of his discharge.  HISTORY:  This patient had fallen out of bed and hit his shoulder and had problems moving his left arm.  He presented to the ED with these symptoms, did have severe shortness of breath.  Does have known COPD with previous myocardial infarction, but does not have any known history of congestive heart failure.  He appeared to be short of breath, had an elevated BNP.  His chest x-ray looked like some mild pulmonary edema, also had slightly elevated troponin level.  EKG showed nonspecific changes, but did not show any ST elevation suggestive of acute MI.  He was moving his arm better on admission, but chronically short of breath, was admitted to the step-down unit in intensive care.  PHYSICAL EXAMINATION:  VITAL SIGNS:  On admission, alert male with blood pressure 142/64, pulse 83, temp 99. GENERAL:  The patient is somewhat obese. He was awake and alert at time of his initial examination. HEENT:  Eyes: PERRLA.  TMs negative.  Oropharynx benign. NECK:  Supple. CHEST:  Clear to P and A with occasional rales heard at the base. HEART:  Regular rhythm. ABDOMEN:  Obese, but no palpable organs or masses. EXTREMITIES:  He did not show any edema in his extremities. CENTRAL NERVOUS SYSTEM:  Grossly normal.  LABORATORY DATA:  Blood  gases on admission, FiO2 21, pH 7.356 with a pCO2 of 65.5, pO2 80, bicarbonate 35.7.  CBC on admission, WBC 9000 with hemoglobin 17.4 and hematocrit 54.1.  Basic metabolic panel, sodium 138, potassium 4.7, chloride 94, CO2 34.  BNP initially was 6273, subsequent 233.3.  Glucoses ranged from 122 to 211.  Chemistries 3 days ago, glucose 147, BUN 13, creatinine 0.67, calcium 9.8, GFR greater than 90. Cardiac panel on admission, CK 82, CK subsequent 63; CK-MB on admission 5, CK-MB subsequently 4.50 initially; troponin 1.25, subsequent, 1.54, subsequent 1.17.  Chest x-ray on admission showed chronic bronchitic-type lung changes, low lung volumes with vascular crowding and atelectasis.  CT of the head, stable age-related cerebral atrophy and periventricular white matter disease.  No acute intracranial findings or mass lesions.  HOSPITAL COURSE:  The patient on admission was placed on nasal O2.  He was placed on following medications: 1. Aspirin enteric coated 325 mg daily. 2. Benazepril 10 mg daily. 3. Cardizem 60 mg every 8 hours. 4. Docusate sodium 100 mg b.i.d. 5. Subcutaneous Lovenox 40 mg daily for DVT prophylaxis. 6. The patient was placed on Lantus insulin 20 units daily and sliding     scale insulin aspart. 7. Meclizine 25 mg b.i.d. 8.  Avelox 400 mg IV q.24 hour. 9. He was continued on Crestor 10 mg daily. 10.Polyethylene glycol 17 g daily. 11.The patient towards the latter part of hospitalization was placed     on Symbicort 160/4.52 two puffs b.i.d. 12.Spiriva 18 mcg daily.  The patient did have some episodes of increasing CO2 initially, but improved with respiratory treatments.  He was seen in consultation by both Dr. Juanetta Gosling and by [facility] as well as with the Neurology, Dr. Gerilyn Pilgrim.  Cardiology felt that he did have CHF, but this improved with p.o. Lasix and they recommend changing his Cardizem 30 mg every 8 hours to Cardizem 180 mg daily.  It was felt  that he had hypoxia in the setting of CHF for respiratory distress. His antianxiety medications and narcotics were reduced and he appeared to be more alert and much better.  He had no acute EKG changes.  No invasive cardiac workup was planned, but medical management only would be continued.  His nitroglycerin paste was discontinued.  Dr. Juanetta Gosling felt that he had severe COPD, but this did improve.  He does have cervical spondylosis and radiculopathy and has pain in his back and joints.  Dr. Gerilyn Pilgrim felt that he had episodes of negative myoclonus. EEG apparently was negative.  He felt that he could be helped if symptoms are problematic with clonazepam 0.5 mg b.i.d. or 0.25 mg b.i.d.  The patient progressively improved throughout his hospital stay and on the 5th hospital day, he had some consideration that he could be discharged.  He will be discharged on the following medications: 1. Xanax 0.5 mg t.i.d. p.r.n. 2. Nitroglycerin 0.4 mg every 5 minutes p.r.n. 3. Desyrel 25 mg at bedtime. 4. Aspirin 325 mg daily. 5. Lotensin 10 mg daily. 6. Symbicort 160/4.5 mcg 2 puffs b.i.d. 7. Cardizem 180 mg daily. 8. Colace 100 mg b.i.d. 9. Lasix 40 mg daily. 10.Lantus insulin 20 units daily. 11.Antivert 25 mg b.i.d. 12.MiraLAX 17 g daily. 13.Crestor 10 mg daily. 14.Spiriva 18 mcg daily.  The patient was stable at the time of his discharge.     Kerstin Crusoe G. Renard Matter, MD     AGM/MEDQ  D:  06/06/2011  T:  06/06/2011  Job:  [id number]

## 2011-06-06 NOTE — Procedures (Signed)
NAMEVINCENT, [name]              ACCOUNT NO.:  [id number]  MEDICAL RECORD NO.:  [id number]  LOCATION:  IC03                          FACILITY:  [facility]  PHYSICIAN:  Bhavya Eschete A. Gerilyn Pilgrim, M.D. DATE OF BIRTH:  1941/05/19  DATE OF PROCEDURE:  06/04/2011 DATE OF DISCHARGE:                             EEG INTERPRETATION   INDICATION:  This is a 71 year old man who presents with recurrent spells of movements of the upper extremities suspicious for possible seizures.  MEDICATIONS:  Lotensin, aspirin, Symbicort, Cardizem, Colace, Lovenox, Lasix, heparin, insulin, Xopenex, Antivert, Avelox, Nitrostat, Crestor, Zoloft, potassium, Zocor, Desyrel, Plavix.  ANALYSIS:  This is a 16 channel recording for approximately 20 minutes. Standard 10/20 measurements are used.  There is a well-formed posterior dominant rhythm of 7.5 Hz which attenuates with eye opening.  There is beta activity observed in the frontal areas.  Awake and drowsy activities are recorded.  Photic stimulation is carried out without abnormal changes in the background activity.  There is no focal or lateralized slowing.  There is no epileptiform activity.  IMPRESSION:  Mild generalized slowing.  Otherwise no epileptiform activity.     Dan Scearce A. Gerilyn Pilgrim, M.D.     KAD/MEDQ  D:  06/05/2011  T:  06/06/2011  Job:  [id number]

## 2011-06-06 NOTE — Discharge Planning (Signed)
DISCHARGE INSTRUCTIONS REVIEWED W/ PT AND HIS WIFE. [facility] HAS BEEN [facility].DME SUPPLIES ORDERED THROUGH [facility] FROM [facility]. EMS HAS BEEN CALLED TO TRANSPORT PT HOME VIA AMBULANCE. IV D/C'D

## 2011-06-06 NOTE — Progress Notes (Signed)
[name], [name]              ACCOUNT NO.:  [id number]  MEDICAL RECORD NO.:  [id number]  LOCATION:                                 FACILITY:  PHYSICIAN:  Temika Sutphin G. Renard Matter, MD   DATE OF BIRTH:  28-Feb-1941  DATE OF PROCEDURE: DATE OF DISCHARGE:                                PROGRESS NOTE   This patient had nosebleed this morning which stopped spontaneously.  He continues to have a great deal of back pain.  He was seen by Neurology, an EEG was ordered.  He has also been seen by Cardiology.  Of note, he has minor T-wave abnormalities.  A 2D echo showed normal left ventricular function.  He does have a history of spinal stenosis.  He has been severely debilitated, because of this, he does have COPD.  He has been started on Spiriva and Symbicort.  OBJECTIVE:  VITAL SIGNS:  Blood pressure 121/62, respirations 19, pulse 109, temp 98.3. LUNGS:  Diminished breath sounds. HEART:  Regular rhythm. ABDOMEN:  No palpable organs or masses.  ASSESSMENT:  The patient does have chronic obstructive pulmonary disease, coronary artery disease with previous myocardial infarction, diabetes mellitus, and spinal stenosis.  PLAN:  To continue current regimen.  He is receiving Lantus insulin 15 units daily, Spiriva inhalation 18 mcg daily, Symbicort 160/4.5 two puffs b.i.d., he is on Avelox 400 mg daily.  Continue current regimen. We will await results of EEG.     Asta Corbridge G. Renard Matter, MD     AGM/MEDQ  D:  06/05/2011  T:  06/05/2011  Job:  [id number]

## 2011-06-06 NOTE — Discharge Planning (Signed)
PT'S O2 SAT IS 92% ON O2 AT 3L/MIN VIA N/C. WHEN O2 TAKEN OFF FOR O2 SAT DROPPED TO 86% ON ROOM.

## 2011-06-06 NOTE — Progress Notes (Signed)
Physical Therapy Evaluation Patient Details Name: [name] MRN: [id number] DOB: 1941-02-09 Today's Date: 06/06/2011  Problem List:  Patient Active Problem List  Diagnoses  . DIABETES MELLITUS, TYPE II  . HYPERLIPIDEMIA-MIXED  . OBESITY  . HYPERTENSION, UNSPECIFIED  . MI  . Coronary atherosclerosis of native coronary artery  . COPD  . SLEEP APNEA  . HYPOXEMIA  . TOBACCO ABUSE  . Pulmonary edema  . Cervical spondylosis with radiculopathy  . Acute myocardial infarction, subendocardial infarction    Past Medical History:  Past Medical History  Diagnosis Date  . COPD (chronic obstructive pulmonary disease)   . Diabetes mellitus   . Spinal disease   . Myocardial infarct    Past Surgical History:  Past Surgical History  Procedure Date  . Back surgery     PT Assessment/Plan/Recommendation PT Assessment Clinical Impression Statement: Pt's wife was present to discuss home DME needs...pt is essentially total care in the home and she is having increasing difficulty transferring pt from bed to [facility] or w/c.Marland KitchenMarland Kitchenpt has no ability to assist with transfer at this point...it was determined that they will need a hoyer lift for the home with a sling suitable to be used with a commode chair as well as a HHPT consult  to work with his wife on transfer technique PT Recommendation/Assessment: All further PT needs can be met in the next venue of care PT Problem List: Decreased strength;Decreased activity tolerance;Decreased balance;Decreased mobility;Decreased knowledge of use of DME;Decreased safety awareness PT Therapy Diagnosis : Other (comment) (pt now total care) PT Recommendation Follow Up Recommendations: Home health PT Equipment Recommended: Other (comment) (hoyer lift for home with [facility] appropriate sling) PT Goals     PT Evaluation Precautions/Restrictions  Precautions Precautions: Fall Required Braces or Orthoses: No Restrictions Weight Bearing Restrictions: Yes RLE Weight  Bearing: Partial weight bearing LLE Weight Bearing: Partial weight bearing Prior Functioning  Home Living Lives With: Spouse Receives Help From: Family Type of Home: House Home Layout: One level Home Access: Ramped entrance Home Adaptive Equipment: Bedside commode/3-in-1;Hospital bed Prior Function Level of Independence: Needs assistance with ADLs;Needs assistance with tranfers Driving: No Vocation: Retired Producer, television/film/video: Awake/alert Overall Cognitive Status: Appears within functional limits for tasks assessed Orientation Level: Oriented to person;Oriented to place;Oriented to situation Sensation/Coordination   Extremity Assessment   Mobility (including Balance) Bed Mobility Bed Mobility:  (pt needs total assist for all transfers)    Exercise    End of Session PT - End of Session Patient left: in bed Nurse Communication: Need for lift equipment General Behavior During Session: [facility] for tasks performed Cognition: [facility] for tasks performed  Konrad Penta 06/06/2011, 10:38 AM

## 2011-06-06 NOTE — Discharge Planning (Signed)
EMS IS HERE TO TRANSPORT PT TO HOME VIA AMBULANCE. O2 AT 2L/MIN VIA N/C. PT ALERT AND ORIENTED. DEIES ANY PAIN AT THIS TIME. PT'S WIFE WENT AHEAD TO OPEN HOME FOR EMS.

## 2011-06-06 NOTE — Progress Notes (Signed)
NAMEKAMAREON, SCIANDRA              ACCOUNT NO.:  [id number]  MEDICAL RECORD NO.:  [id number]  LOCATION:  IC03                          FACILITY:  [facility]  PHYSICIAN:  Ximena Todaro L. Juanetta Gosling, M.D.DATE OF BIRTH:  11-13-1940  DATE OF PROCEDURE: DATE OF DISCHARGE:  06/06/2011                                PROGRESS NOTE   Mr. Kessner is in the Intensive Care Unit still, but I think he has had orders to move.  We just did not have rooms available.  He says he is doing okay and apparently he is being scheduled for potential discharge later today.  I think it would be okay to discharge him if he is okay from other standpoints.  From a strictly Pulmonary point of view, he could go home.  I think overall he has improved, and I would like to see him sent home with his current medications.  I have discussed all that with the patient and he understands.  Dr. Renard Matter will re-evaluate him later today.  PHYSICAL EXAMINATION:  GENERAL:  He is awake and alert and he looks pretty comfortable. HEART:  Regular. ABDOMEN:  Soft. CHEST:  Relatively clear.  ASSESSMENT:  He has pretty severe chronic obstructive pulmonary disease. He seems to be improving.  He would be okay for discharge today if okay with Dr. Renard Matter and other consultants.  Thank you very much for allowing me to see him with you.     Odella Appelhans L. Juanetta Gosling, M.D.     ELH/MEDQ  D:  06/06/2011  T:  06/06/2011  Job:  [id number]

## 2011-06-06 NOTE — Progress Notes (Signed)
UR Chart Review Completed  

## 2011-06-06 NOTE — Progress Notes (Signed)
CSW arranged transport home for pt by pt and family request.  Pt's wife confirmed address and will be present at home upon EMS arrival.  Karn Cassis

## 2011-06-07 NOTE — Progress Notes (Signed)
Discharge summary sent to payer through [facility]  

## 2011-06-07 NOTE — Progress Notes (Signed)
CARE MANAGEMENT NOTE 06/07/2011  Patient:  [name], [name]   Account Number:  [id number]  Date Initiated:  06/03/2011  Documentation initiated by:  Rosemary Holms  Subjective/Objective Assessment:   Pt admitted from home with CHF and elevated troponin levels. PTA lived at home with his spouse. Bedridden and has very little strength in his arms.     Action/Plan:   Spoke to pt and wife at bedside. Wife is very concerned about caring for pt at home. Pt has previously refused NH placement.   Anticipated DC Date:  06/07/2011   Anticipated DC Plan:  HOME W HOME HEALTH SERVICES      DC Planning Services  CM consult      Choice offered to / List presented to:     DME arranged  [facility]      DME agency  [facility]     HH arranged  HH-1 RN  HH-2 PT  HH-4 NURSE'S AIDE  HH-6 SOCIAL WORKER      HH agency  [facility]   Status of service:  Completed, signed off Medicare Important Message given?   (If response is "NO", the following Medicare IM given date fields will be blank) Date Medicare IM given:   Date Additional Medicare IM given:    Discharge Disposition:    Per UR Regulation:    Comments:  06/06/11 Rosemary Holms RN BSN CM DME includes Michiel Sites Lift  06/02/10 1030 Brendan Gruwell RN BSN CM wife states that pt may need [name] lift since she can not support him for transfer to [facility]. [facility]) At minimum will [facility] RN and aide. CM would suggest pt needs total care.

## 2011-07-01 ENCOUNTER — Encounter: Payer: Self-pay | Admitting: Cardiology

## 2011-07-03 ENCOUNTER — Ambulatory Visit: Payer: Medicare Other | Admitting: Cardiology

## 2011-07-03 ENCOUNTER — Ambulatory Visit (INDEPENDENT_AMBULATORY_CARE_PROVIDER_SITE_OTHER): Payer: Medicare Other | Admitting: Cardiology

## 2011-07-03 ENCOUNTER — Encounter: Payer: Self-pay | Admitting: Cardiology

## 2011-07-03 VITALS — BP 123/75 | HR 69 | Resp 18 | Ht 71.0 in | Wt 205.0 lb

## 2011-07-03 DIAGNOSIS — I5032 Chronic diastolic (congestive) heart failure: Secondary | ICD-10-CM | POA: Insufficient documentation

## 2011-07-03 DIAGNOSIS — I2510 Atherosclerotic heart disease of native coronary artery without angina pectoris: Secondary | ICD-10-CM

## 2011-07-03 DIAGNOSIS — I509 Heart failure, unspecified: Secondary | ICD-10-CM

## 2011-07-03 DIAGNOSIS — E119 Type 2 diabetes mellitus without complications: Secondary | ICD-10-CM

## 2011-07-03 DIAGNOSIS — F17200 Nicotine dependence, unspecified, uncomplicated: Secondary | ICD-10-CM

## 2011-07-03 DIAGNOSIS — I252 Old myocardial infarction: Secondary | ICD-10-CM

## 2011-07-03 DIAGNOSIS — R0902 Hypoxemia: Secondary | ICD-10-CM

## 2011-07-03 DIAGNOSIS — J449 Chronic obstructive pulmonary disease, unspecified: Secondary | ICD-10-CM

## 2011-07-03 NOTE — Assessment & Plan Note (Signed)
He has quit for the last 30 days. I have strongly advised to stay off the cigarettes since his lungs will stabilize and his lower extremity perfusion will improve.

## 2011-07-03 NOTE — Progress Notes (Signed)
HPI  [name] returns today for hospital followup after being admitted with acute COPD exacerbation. He actually had fallen out of bed and there was concern about narcotic-induced respiratory suppression.  He had a subendocardial MI felt secondary to his severe acute exacerbation of his COPD. Echocardiogram showed an ejection fraction of 55% with some akinesia of the inferior septum.  He was diagnosed with some pulmonary edema with acute diastolic heart failure.  Since discharge he is quit smoking. He is now on oxygen 24 7. He is in a motorized wheelchair.  He seems to be compliant with medications. Past Medical History  Diagnosis Date  . COPD (chronic obstructive pulmonary disease)   . Diabetes mellitus   . Spinal disease   . Myocardial infarct     Current Outpatient Prescriptions  Medication Sig Dispense Refill  . albuterol (PROVENTIL HFA;VENTOLIN HFA) 108 (90 BASE) MCG/ACT inhaler Inhale 2 puffs into the lungs every 6 (six) hours as needed.      . ALPRAZolam (XANAX) 0.5 MG tablet Take 0.5 mg by mouth 3 (three) times daily as needed. anxiety       . benazepril (LOTENSIN) 10 MG tablet Take 10 mg by mouth daily.        . budesonide-formoterol (SYMBICORT) 160-4.5 MCG/ACT inhaler Inhale 2 puffs into the lungs 2 (two) times daily.  1 Inhaler  5  . diltiazem (DILACOR XR) 180 MG 24 hr capsule Take 180 mg by mouth daily.      . furosemide (LASIX) 20 MG tablet Take 20 mg by mouth daily.      . meclizine (ANTIVERT) 25 MG tablet Take 25 mg by mouth 2 (two) times daily.        . metFORMIN (GLUCOPHAGE) 1000 MG tablet Take 1 tablet (1,000 mg total) by mouth 2 (two) times daily with a meal.  90 tablet  3  . oxyCODONE-acetaminophen (PERCOCET) 7.5-500 MG per tablet Take 1 tablet by mouth 3 (three) times daily as needed. pain       . sertraline (ZOLOFT) 100 MG tablet Take 100 mg by mouth daily.        Marland Kitchen tiotropium (SPIRIVA) 18 MCG inhalation capsule Place 1 capsule (18 mcg total) into inhaler and  inhale daily.  30 capsule  2  . traMADol (ULTRAM) 50 MG tablet Take 100 mg by mouth 2 (two) times daily as needed. pain       . ZOCOR 40 MG tablet TAKE ONE TABLET DAILY.  30 each  5    Allergies  Allergen Reactions  . Codeine     Family History  Problem Relation Age of Onset  . COPD Father     History   Social History  . Marital Status: Married    Spouse Name: N/A    Number of Children: N/A  . Years of Education: N/A   Occupational History  . Not on file.   Social History Main Topics  . Smoking status: Former Games developer  . Smokeless tobacco: Not on file  . Alcohol Use: No  . Drug Use: No  . Sexually Active: Not on file   Other Topics Concern  . Not on file   Social History Narrative  . No narrative on file    ROS ALL NEGATIVE EXCEPT THOSE NOTED IN HPI  PE  General Appearance: well developed, well nourished in no acute distress, chronically ill and he is clearly lost weight since I last saw him. HEENT: symmetrical face, PERRLA, , wearing O2 Neck: no JVD,  thyromegaly, or adenopathy, trachea midline Chest: symmetric without deformity Cardiac: PMI non-displaced, RRR, normal S1, S2, no gallop or murmur Lung: Clear with decreased breath sounds throughout. Vascular: Dorsalis pedis pulses are 2+ over 4+.  Abdominal: nondistended, nontender, good bowel sounds, no HSM, no bruits Extremities: Dependent rubor and even a purplish discoloration., clubbing or edema, no sign of DVT, no varicosities  Skin: normal color, no rashes Neuro: alert and oriented x 3, non-focal Pysch: normal affect  EKG Not repeated BMET    Component Value Date/Time   NA 135 06/04/2011 0948   K 4.0 06/04/2011 0948   CL 90* 06/04/2011 0948   CO2 36* 06/04/2011 0948   GLUCOSE 301* 06/04/2011 0948   BUN 18 06/04/2011 0948   CREATININE 0.81 06/04/2011 0948   CALCIUM 9.0 06/04/2011 0948   GFRNONAA 87* 06/04/2011 0948   GFRAA >90 06/04/2011 0948    Lipid Panel     Component Value Date/Time   CHOL 97 07/24/2010  1108   TRIG 291.0* 07/24/2010 1108   HDL 27.30* 07/24/2010 1108   CHOLHDL 4 07/24/2010 1108   VLDL 58.2* 07/24/2010 1108    CBC    Component Value Date/Time   WBC 8.9 06/05/2011 0436   RBC 4.86 06/05/2011 0436   HGB 14.8 06/05/2011 0436   HCT 46.9 06/05/2011 0436   PLT 167 06/05/2011 0436   MCV 96.5 06/05/2011 0436   MCH 30.5 06/05/2011 0436   MCHC 31.6 06/05/2011 0436   RDW 15.8* 06/05/2011 0436   LYMPHSABS 1.9 11/10/2008 1108   MONOABS 0.5 11/10/2008 1108   EOSABS 0.6 11/10/2008 1108   BASOSABS 0.0 11/10/2008 1108

## 2011-07-03 NOTE — Patient Instructions (Signed)
**Note De-identified Pura Picinich Obfuscation** Your physician recommends that you continue on your current medications as directed. Please refer to the Current Medication list given to you today.  Your physician recommends that you schedule a follow-up appointment in: 6 months  

## 2011-07-03 NOTE — Assessment & Plan Note (Signed)
Stabilized. Continue current medical therapy.

## 2011-07-19 ENCOUNTER — Other Ambulatory Visit: Payer: Self-pay | Admitting: Cardiology

## 2011-12-02 ENCOUNTER — Other Ambulatory Visit: Payer: Self-pay

## 2011-12-02 MED ORDER — SIMVASTATIN 40 MG PO TABS: 40.0000 mg | ORAL_TABLET | Freq: Every day | ORAL | Status: DC

## 2012-01-07 ENCOUNTER — Encounter: Payer: Self-pay | Admitting: Cardiology

## 2012-01-07 ENCOUNTER — Ambulatory Visit (INDEPENDENT_AMBULATORY_CARE_PROVIDER_SITE_OTHER): Payer: Medicare Other | Admitting: Cardiology

## 2012-01-07 VITALS — BP 116/74 | HR 92 | Ht 71.0 in | Wt 215.0 lb

## 2012-01-07 DIAGNOSIS — I5032 Chronic diastolic (congestive) heart failure: Secondary | ICD-10-CM

## 2012-01-07 DIAGNOSIS — I252 Old myocardial infarction: Secondary | ICD-10-CM

## 2012-01-07 DIAGNOSIS — E119 Type 2 diabetes mellitus without complications: Secondary | ICD-10-CM

## 2012-01-07 DIAGNOSIS — G4730 Sleep apnea, unspecified: Secondary | ICD-10-CM

## 2012-01-07 DIAGNOSIS — E785 Hyperlipidemia, unspecified: Secondary | ICD-10-CM

## 2012-01-07 DIAGNOSIS — I2510 Atherosclerotic heart disease of native coronary artery without angina pectoris: Secondary | ICD-10-CM

## 2012-01-07 DIAGNOSIS — I10 Essential (primary) hypertension: Secondary | ICD-10-CM

## 2012-01-07 NOTE — Assessment & Plan Note (Signed)
Stable. No change in medical therapy. Return the office in one year. 

## 2012-01-07 NOTE — Patient Instructions (Addendum)
Your physician recommends that you continue on your current medications as directed. Please refer to the Current Medication list given to you today.  Your physician recommends that you schedule a follow-up appointment in: 1 year  

## 2012-01-07 NOTE — Progress Notes (Signed)
HPI [name] returns today for evaluation and management of his history of coronary artery disease and history of MI.  He is wheelchair-bound from his arthritis and also has severe O2 dependent COPD. Blood gas earlier this year showed a CO2 of 60. He is on multiple inhalers and respiratory meds.  He is no longer smoking.  He denies any chest pain or angina. He's had no palpitations presyncope or syncope. There's been no significant edema. He also has sleep apnea. He does not wear CPAP, wears O2 at night.  Past Medical History  Diagnosis Date  . COPD (chronic obstructive pulmonary disease)   . Diabetes mellitus   . Spinal disease   . Myocardial infarct     Current Outpatient Prescriptions  Medication Sig Dispense Refill  . ACCU-CHEK AVIVA PLUS test strip 1 each by Other route as needed.       Marland Kitchen ACCU-CHEK FASTCLIX LANCETS MISC 1 each by Other route as needed.       Marland Kitchen albuterol (PROVENTIL HFA;VENTOLIN HFA) 108 (90 BASE) MCG/ACT inhaler Inhale 2 puffs into the lungs every 6 (six) hours as needed.      . ALPRAZolam (XANAX) 0.5 MG tablet Take 0.5 mg by mouth 3 (three) times daily as needed. anxiety       . budesonide-formoterol (SYMBICORT) 160-4.5 MCG/ACT inhaler Inhale 2 puffs into the lungs 2 (two) times daily.  1 Inhaler  5  . diltiazem (DILACOR XR) 180 MG 24 hr capsule Take 180 mg by mouth daily.      . furosemide (LASIX) 20 MG tablet Take 20 mg by mouth daily.      Marland Kitchen gabapentin (NEURONTIN) 100 MG capsule Take 100 mg by mouth daily.       . hydrocortisone valerate cream (WESTCORT) 0.2 % Apply 1 application topically daily.       . hydrOXYzine (ATARAX/VISTARIL) 25 MG tablet Take 50 mg by mouth 2 (two) times daily.       Marland Kitchen LOTENSIN 10 MG tablet TAKE ONE TABLET DAILY.  90 each  2  . meclizine (ANTIVERT) 25 MG tablet Take 25 mg by mouth 2 (two) times daily.        . metFORMIN (GLUCOPHAGE) 1000 MG tablet Take 1 tablet (1,000 mg total) by mouth 2 (two) times daily with a meal.  90 tablet  3   . oxyCODONE-acetaminophen (PERCOCET) 7.5-500 MG per tablet Take 1 tablet by mouth 3 (three) times daily as needed. pain       . sertraline (ZOLOFT) 100 MG tablet Take 100 mg by mouth daily.        . simvastatin (ZOCOR) 40 MG tablet Take 1 tablet (40 mg total) by mouth at bedtime.  30 tablet  6  . tiotropium (SPIRIVA) 18 MCG inhalation capsule Place 1 capsule (18 mcg total) into inhaler and inhale daily.  30 capsule  2  . traMADol (ULTRAM) 50 MG tablet Take 100 mg by mouth 2 (two) times daily as needed. pain         Allergies  Allergen Reactions  . Codeine     Family History  Problem Relation Age of Onset  . COPD Father     History   Social History  . Marital Status: Married    Spouse Name: N/A    Number of Children: N/A  . Years of Education: N/A   Occupational History  . Not on file.   Social History Main Topics  . Smoking status: Former Games developer  . Smokeless tobacco:  Not on file  . Alcohol Use: No  . Drug Use: No  . Sexually Active: Not on file   Other Topics Concern  . Not on file   Social History Narrative  . No narrative on file    ROS ALL NEGATIVE EXCEPT THOSE NOTED IN HPI  PE  General Appearance: well developed, well nourished in no acute distress, chronically ill, bearded as usual. He's wearing O2 in a wheelchair. HEENT: symmetrical face, PERRLA,   Neck: no JVD, thyromegaly, or adenopathy, trachea midline Chest: symmetric without deformity Cardiac: PMI non-displaced, RRR, normal S1, S2, no gallop or murmur Lung: Decreased breath sounds throughout with bilateral basilar dry crackles left greater than right Vascular: all pulses full without bruits  Abdominal: nondistended, nontender, good bowel sounds, no HSM, no bruits Extremities: no cyanosis, pulses present but reduced. Skin is tight which is baseline. no sign of DVT, no varicosities  Skin: normal color, no rashes Neuro: alert and oriented x 3, non-focal Pysch: normal affect  EKG EKG not  repeated BMET    Component Value Date/Time   NA 135 06/04/2011 0948   K 4.0 06/04/2011 0948   CL 90* 06/04/2011 0948   CO2 36* 06/04/2011 0948   GLUCOSE 301* 06/04/2011 0948   BUN 18 06/04/2011 0948   CREATININE 0.81 06/04/2011 0948   CALCIUM 9.0 06/04/2011 0948   GFRNONAA 87* 06/04/2011 0948   GFRAA >90 06/04/2011 0948    Lipid Panel     Component Value Date/Time   CHOL 97 07/24/2010 1108   TRIG 291.0* 07/24/2010 1108   HDL 27.30* 07/24/2010 1108   CHOLHDL 4 07/24/2010 1108   VLDL 58.2* 07/24/2010 1108    CBC    Component Value Date/Time   WBC 8.9 06/05/2011 0436   RBC 4.86 06/05/2011 0436   HGB 14.8 06/05/2011 0436   HCT 46.9 06/05/2011 0436   PLT 167 06/05/2011 0436   MCV 96.5 06/05/2011 0436   MCH 30.5 06/05/2011 0436   MCHC 31.6 06/05/2011 0436   RDW 15.8* 06/05/2011 0436   LYMPHSABS 1.9 11/10/2008 1108   MONOABS 0.5 11/10/2008 1108   EOSABS 0.6 11/10/2008 1108   BASOSABS 0.0 11/10/2008 1108

## 2012-04-15 ENCOUNTER — Other Ambulatory Visit: Payer: Self-pay | Admitting: Cardiology

## 2012-04-15 MED ORDER — BENAZEPRIL HCL 10 MG PO TABS: 10.0000 mg | ORAL_TABLET | Freq: Every day | ORAL | Status: DC

## 2012-04-23 ENCOUNTER — Encounter (HOSPITAL_COMMUNITY)
Admission: EM | Disposition: A | Discharge status: Home / Self Care | Payer: Self-pay | Source: Home / Self Care | Attending: Emergency Medicine

## 2012-04-23 ENCOUNTER — Encounter (HOSPITAL_COMMUNITY): Payer: Self-pay | Admitting: *Deleted

## 2012-04-23 ENCOUNTER — Emergency Department (HOSPITAL_COMMUNITY): Payer: Medicare Other

## 2012-04-23 ENCOUNTER — Emergency Department (HOSPITAL_COMMUNITY)
Admission: EM | Admit: 2012-04-23 | Discharge: 2012-04-24 | Disposition: A | Discharge status: Home / Self Care | Payer: Medicare Other | Attending: Emergency Medicine | Admitting: Emergency Medicine

## 2012-04-23 DIAGNOSIS — Y9389 Activity, other specified: Secondary | ICD-10-CM | POA: Insufficient documentation

## 2012-04-23 DIAGNOSIS — Z87891 Personal history of nicotine dependence: Secondary | ICD-10-CM | POA: Insufficient documentation

## 2012-04-23 DIAGNOSIS — Z9981 Dependence on supplemental oxygen: Secondary | ICD-10-CM | POA: Insufficient documentation

## 2012-04-23 DIAGNOSIS — Z79899 Other long term (current) drug therapy: Secondary | ICD-10-CM | POA: Insufficient documentation

## 2012-04-23 DIAGNOSIS — Y929 Unspecified place or not applicable: Secondary | ICD-10-CM | POA: Insufficient documentation

## 2012-04-23 DIAGNOSIS — IMO0002 Reserved for concepts with insufficient information to code with codable children: Secondary | ICD-10-CM | POA: Insufficient documentation

## 2012-04-23 DIAGNOSIS — J4489 Other specified chronic obstructive pulmonary disease: Secondary | ICD-10-CM | POA: Insufficient documentation

## 2012-04-23 DIAGNOSIS — T18108A Unspecified foreign body in esophagus causing other injury, initial encounter: Secondary | ICD-10-CM | POA: Insufficient documentation

## 2012-04-23 DIAGNOSIS — K222 Esophageal obstruction: Secondary | ICD-10-CM

## 2012-04-23 DIAGNOSIS — E119 Type 2 diabetes mellitus without complications: Secondary | ICD-10-CM | POA: Insufficient documentation

## 2012-04-23 DIAGNOSIS — J449 Chronic obstructive pulmonary disease, unspecified: Secondary | ICD-10-CM | POA: Insufficient documentation

## 2012-04-23 DIAGNOSIS — I252 Old myocardial infarction: Secondary | ICD-10-CM | POA: Insufficient documentation

## 2012-04-23 DIAGNOSIS — Z8739 Personal history of other diseases of the musculoskeletal system and connective tissue: Secondary | ICD-10-CM | POA: Insufficient documentation

## 2012-04-23 HISTORY — PX: FOREIGN BODY REMOVAL: SHX962

## 2012-04-23 LAB — CBC WITH DIFFERENTIAL/PLATELET
Basophils Absolute: 0 10*3/uL (ref 0.0–0.1)
Basophils Relative: 0 % (ref 0–1)
Hemoglobin: 14.3 g/dL (ref 13.0–17.0)
MCHC: 32.7 g/dL (ref 30.0–36.0)
Monocytes Relative: 5 % (ref 3–12)
Neutro Abs: 9.7 10*3/uL — ABNORMAL HIGH (ref 1.7–7.7)
Neutrophils Relative %: 86 % — ABNORMAL HIGH (ref 43–77)
Platelets: 242 10*3/uL (ref 150–400)

## 2012-04-23 LAB — POCT I-STAT, CHEM 8
Chloride: 104 mEq/L (ref 96–112)
Glucose, Bld: 186 mg/dL — ABNORMAL HIGH (ref 70–99)
HCT: 45 % (ref 39.0–52.0)
Potassium: 3.9 mEq/L (ref 3.5–5.1)
Sodium: 141 mEq/L (ref 135–145)

## 2012-04-23 SURGERY — REMOVAL, FOREIGN BODY
Anesthesia: Moderate Sedation

## 2012-04-23 SURGERY — EGD (ESOPHAGOGASTRODUODENOSCOPY)
Anesthesia: Moderate Sedation

## 2012-04-23 MED ORDER — BENAZEPRIL HCL 10 MG PO TABS
10.0000 mg | ORAL_TABLET | Freq: Every morning | ORAL | Status: DC
  Administered 2012-04-23: 10 mg via ORAL
  Filled 2012-04-23 (×2): qty 1

## 2012-04-23 MED ORDER — SODIUM CHLORIDE 0.9 % IV SOLN
INTRAVENOUS | Status: DC
  Administered 2012-04-23: 100 mL/h via INTRAVENOUS

## 2012-04-23 MED ORDER — DILTIAZEM HCL ER 180 MG PO CP24
180.0000 mg | ORAL_CAPSULE | Freq: Every morning | ORAL | Status: DC
  Administered 2012-04-23: 180 mg via ORAL
  Filled 2012-04-23 (×2): qty 1

## 2012-04-23 MED ORDER — SODIUM CHLORIDE 0.9 % IV BOLUS (SEPSIS)
700.0000 mL | Freq: Once | INTRAVENOUS | Status: AC
  Administered 2012-04-23: 700 mL via INTRAVENOUS

## 2012-04-23 NOTE — ED Notes (Signed)
Pt discharged. Pt stable at time of discharge. pt has no questions regarding discharge at this time. Pt voiced understanding of discharge instructions.  

## 2012-04-23 NOTE — ED Provider Notes (Addendum)
History   This chart was scribed for Ward Givens, MD by Gerlean Ren, ED Scribe. This patient was seen in room APA14/APA14 and the patient's care was started at 5:05 PM    CSN: [id number]  Arrival date & time 04/23/12  1647   First MD Initiated Contact with Patient 04/23/12 1651      Chief Complaint  Patient presents with  . difficultly swallowing      The history is provided by the patient. No language interpreter was used.   [name] GANGL is a 71 y.o. male with h/o COPD, DM, and MI who presents to the Emergency Department complaining of difficulty swallowing beginning yesterday after eating 1inch x 1inch pieces of fried chicken that his wife cut up for him that has forced pt to constantly spit out his saliva. He has been unable to eat or drink since that time.  Pt states it feels like the problem is in his upper chest and does not go further down his chest.  Pt is edentulous and states h/o choking when eating "for years" and h/o similar similar difficulty swallowing that normally resolves by itself.  Pt states he has had endoscopy for gastric ulcers in [place] but cannot recall physician's name, date of procedure, or any findings.  Pt denies chest pain, fever, and any dyspnea worse than baseline.  Pt also reports one week of a cough producing white phlegm attributed to having a cold with no associated sore throat.  Pt is on 3L oxygen at home.  Pt is a former smoker (quit January 2013) and denies alcohol use.    PCP is Dr. Renard Matter.   Past Medical History  Diagnosis Date  . COPD (chronic obstructive pulmonary disease)   . Diabetes mellitus   . Spinal disease   . Myocardial infarct     Past Surgical History  Procedure Date  . Back surgery     Family History  Problem Relation Age of Onset  . COPD Father     History  Substance Use Topics  . Smoking status: Former Smoker quit in January  . Smokeless tobacco: Not on file  . Alcohol Use: No  Lives at home Lives with  spouse home oxygen 3 lpm [place]  Review of Systems  Constitutional: Negative for fever.  HENT: Positive for trouble swallowing.   Respiratory: Positive for cough. Negative for shortness of breath.   Cardiovascular: Negative for chest pain.  All other systems reviewed and are negative.    Allergies  Codeine  Home Medications   Current Outpatient Rx  Name  Route  Sig  Dispense  Refill  . ACCU-CHEK AVIVA PLUS VI STRP   Other   1 each by Other route as needed.          Marland Kitchen ACCU-CHEK FASTCLIX LANCETS MISC   Other   1 each by Other route as needed.          . ALBUTEROL SULFATE HFA 108 (90 BASE) MCG/ACT IN AERS   Inhalation   Inhale 2 puffs into the lungs every 6 (six) hours as needed.         . ALPRAZOLAM 0.5 MG PO TABS   Oral   Take 0.5 mg by mouth 3 (three) times daily as needed. anxiety          . BENAZEPRIL HCL 10 MG PO TABS   Oral   Take 1 tablet (10 mg total) by mouth daily.   30 tablet   3   .  BUDESONIDE-FORMOTEROL FUMARATE 160-4.5 MCG/ACT IN AERO   Inhalation   Inhale 2 puffs into the lungs 2 (two) times daily.   1 Inhaler   5   . DILTIAZEM HCL ER 180 MG PO CP24   Oral   Take 180 mg by mouth daily.         . FUROSEMIDE 20 MG PO TABS   Oral   Take 20 mg by mouth daily.         Marland Kitchen GABAPENTIN 100 MG PO CAPS   Oral   Take 100 mg by mouth daily.          Marland Kitchen HYDROCORTISONE VALERATE 0.2 % EX CREA   Topical   Apply 1 application topically daily.          Marland Kitchen HYDROXYZINE HCL 25 MG PO TABS   Oral   Take 50 mg by mouth 2 (two) times daily.          Marland Kitchen MECLIZINE HCL 25 MG PO TABS   Oral   Take 25 mg by mouth 2 (two) times daily.           Marland Kitchen METFORMIN HCL 1000 MG PO TABS   Oral   Take 1 tablet (1,000 mg total) by mouth 2 (two) times daily with a meal.   90 tablet   3   . OXYCODONE-ACETAMINOPHEN 7.5-500 MG PO TABS   Oral   Take 1 tablet by mouth 3 (three) times daily as needed. pain          . SERTRALINE HCL 100 MG PO TABS   Oral    Take 100 mg by mouth daily.           Marland Kitchen SIMVASTATIN 40 MG PO TABS   Oral   Take 1 tablet (40 mg total) by mouth at bedtime.   30 tablet   6   . TIOTROPIUM BROMIDE MONOHYDRATE 18 MCG IN CAPS   Inhalation   Place 1 capsule (18 mcg total) into inhaler and inhale daily.   30 capsule   2   . TRAMADOL HCL 50 MG PO TABS   Oral   Take 100 mg by mouth 2 (two) times daily as needed. pain            BP 136/73  Pulse 115  Temp 97.6 F (36.4 C) (Oral)  Resp 18  Ht 6' (1.829 m)  Wt 200 lb (90.719 kg)  BMI 27.12 kg/m2  SpO2 94%  Vital signs normal except tachycardia   Physical Exam  Nursing note and vitals reviewed. Constitutional: He is oriented to person, place, and time. He appears well-developed and well-nourished.  Non-toxic appearance. He does not appear ill. No distress.       Spitting saliva in a cup  HENT:  Head: Normocephalic and atraumatic.  Right Ear: External ear normal.  Left Ear: External ear normal.  Nose: Nose normal. No mucosal edema or rhinorrhea.  Mouth/Throat: Oropharynx is clear and moist and mucous membranes are normal. No dental abscesses or uvula swelling.       Edentulous, has spit bottle with him.  Eyes: Conjunctivae normal and EOM are normal. Pupils are equal, round, and reactive to light.  Neck: Normal range of motion and full passive range of motion without pain. Neck supple.  Cardiovascular: Normal rate, regular rhythm and normal heart sounds.  Exam reveals no gallop and no friction rub.   No murmur heard. Pulmonary/Chest: Effort normal. No respiratory distress. He has no wheezes. He has  no rhonchi. He has no rales. He exhibits no tenderness and no crepitus.       Diffusely diminished breath sounds.  Abdominal: Soft. Normal appearance and bowel sounds are normal. He exhibits no distension. There is no tenderness. There is no rebound and no guarding.  Musculoskeletal: Normal range of motion. He exhibits no edema and no tenderness.       Moves all  extremities well.   Neurological: He is alert and oriented to person, place, and time. He has normal strength. No cranial nerve deficit.  Skin: Skin is warm and intact. No rash noted. No erythema. No pallor.       Skin warm to touch and clammy.  Psychiatric: He has a normal mood and affect. His speech is normal and behavior is normal. His mood appears not anxious.    ED Course  Procedures (including critical care time) DIAGNOSTIC STUDIES: Oxygen Saturation is 94% on [place], adequate by my interpretation.    COORDINATION OF CARE: 5:16 PM- Patient informed of clinical course, understands medical decision-making process, and agrees with plan.  Ordered IV fluids, CBC, and chest XR.  17:37 Dr Jena Gauss, will plan on doing procedure about 16:45, I am to call if has anything acute on labs or CXR before hand. No EKG needed.   19:42 Dr Kendell Bane states has stricture and ring, got the food impaction easily removed,  states heart rate got fast and irregular during procedure. Is sending back to ED. Pt stated he was unable to take him medications today.   20:08 Pt started on his lotensin and his diltiazem orally, his HR now is 106 and is ST by EKG.   21:30 HR 98-106  22:25 HR 95-96 feels ready to go home. Dr Jena Gauss gave wife discharge instructions.   Results for orders placed during the hospital encounter of 04/23/12  CBC WITH DIFFERENTIAL      Component Value Range   WBC 11.3 (*) 4.0 - 10.5 K/uL   RBC 4.80  4.22 - 5.81 MIL/uL   Hemoglobin 14.3  13.0 - 17.0 g/dL   HCT 45.4  09.8 - 11.9 %   MCV 91.0  78.0 - 100.0 fL   MCH 29.8  26.0 - 34.0 pg   MCHC 32.7  30.0 - 36.0 g/dL   RDW 14.7  82.9 - 56.2 %   Platelets 242  150 - 400 K/uL   Neutrophils Relative 86 (*) 43 - 77 %   Neutro Abs 9.7 (*) 1.7 - 7.7 K/uL   Lymphocytes Relative 8 (*) 12 - 46 %   Lymphs Abs 0.9  0.7 - 4.0 K/uL   Monocytes Relative 5  3 - 12 %   Monocytes Absolute 0.5  0.1 - 1.0 K/uL   Eosinophils Relative 1  0 - 5 %   Eosinophils  Absolute 0.1  0.0 - 0.7 K/uL   Basophils Relative 0  0 - 1 %   Basophils Absolute 0.0  0.0 - 0.1 K/uL  POCT I-STAT, CHEM 8      Component Value Range   Sodium 141  135 - 145 mEq/L   Potassium 3.9  3.5 - 5.1 mEq/L   Chloride 104  96 - 112 mEq/L   BUN 13  6 - 23 mg/dL   Creatinine, Ser 1.30  0.50 - 1.35 mg/dL   Glucose, Bld 865 (*) 70 - 99 mg/dL   Calcium, Ion 7.84  6.96 - 1.30 mmol/L   TCO2 23  0 - 100 mmol/L  Hemoglobin 15.3  13.0 - 17.0 g/dL   HCT 09.8  11.9 - 14.7 %   Laboratory interpretation all normal except hyperglycemia, leukocytosis    Dg Chest Port 1 View  04/23/2012  *RADIOLOGY REPORT*  Clinical Data: 71 year old male with shortness of breath.  PORTABLE CHEST - 1 VIEW  Comparison: 06/01/2011 and 10/26/2005 chest radiograph  Findings: This is a very low volume film. Bibasilar atelectasis/scarring is again noted. The cardiomediastinal silhouette is stable. There is no evidence of focal airspace disease, pulmonary edema, suspicious pulmonary nodule/mass, pleural effusion, or pneumothorax. No acute bony abnormalities are identified.  IMPRESSION: Low volume film with bibasilar atelectasis/scarring again noted.   Original Report Authenticated By: Harmon Pier, M.D.    19:52  Date: 04/23/2012  Rate: 106  Rhythm: sinus tachycardia  QRS Axis: right  Intervals: normal  ST/T Wave abnormalities: normal  Conduction Disutrbances:none  Narrative Interpretation:  Q waves in inf leads  Old EKG Reviewed: unchanged from 06/05/2011 HR was 106 then also    1. Esophageal foreign body    Plan to have EDG by Dr Jena Gauss  Devoria Albe, MD, FACEP    MDM   I personally performed the services described in this documentation, which was scribed in my presence. The recorded information has been reviewed and considered.     Ward Givens, MD 04/23/12 Izola Price  Ward Givens, MD 04/23/12 2232

## 2012-04-23 NOTE — Op Note (Signed)
[facility] [address] [place], [address]   ENDOSCOPY PROCEDURE REPORT  PATIENT: [name], [name]  MR#: [id number] BIRTHDATE: [birth year] , 70  yrs. old GENDER: Male ENDOSCOPIST: R.  Roetta Sessions, MD FACP FACG REFERRED BY:  Butch Penny, M.D.  Devoria Albe, M.D. PROCEDURE DATE:  04/23/2012 PROCEDURE:     Emergency EGD with esophageal food disimpaction  INDICATIONS:     Esophageal food impaction.  INFORMED CONSENT:   The risks, benefits, limitations, alternatives and imponderables have been discussed.  The potential for biopsy, esophogeal dilation, etc. have also been reviewed.  Questions have been answered.  All parties agreeable.  Please see the history and physical in the medical record for more information.  MEDICATIONS:       Cetacaine spray and gargle only. Conscious sedation not needed/given  DESCRIPTION OF PROCEDURE:   The XB-[id number] ([id number])  endoscope was introduced through the mouth and advanced to the second portion of the duodenum without difficulty or limitations.  The mucosal surfaces were surveyed very carefully during advancement of the scope and upon withdrawal.  Retroflexion view of the proximal stomach and esophagogastric junction was performed.      FINDINGS: Food bolus found "ball valving" in the distal esophagus. I approximated the tip of the gastroscope against the food bolus and gently apply pressure resulting in the  food bolus popping into the stomach. I noted an inflamed esophageal ring with a stricture component at the GE junction. Please see photos.  The procedure was terminated without completing examination of the upper GI tract (per plan). THERAPEUTIC / DIAGNOSTIC MANEUVERS PERFORMED:    As above. No dilation performed.   COMPLICATIONS:Patient tolerated the procedure well with oropharyngeal anesthesia only.  patient was observed to have intermittent the tachycardia somewhat wide complex up in the 150s on the monitor  before during and after the procedure. He appear to be doing much better after his esophagus was disimpacted. Has not taken any of his cardiac meds for nearly 48 hours  IMPRESSION:  Esophageal food impaction-status post disimpaction as described above. Esophageal stricture/ring. Incomplete examination. Tachycardia observed in the endoscopy unit  RECOMMENDATIONS:  Full Liquid diet. Begin Zegerid suspension 40 mg daily.  We'll schedule an elective EGD with esophageal dilation as appropriate in the near future.  Patient returning to the emergency department for further evaluation of tachyc -arrhythmia. I have discussed my findings with Dr. Lynelle Doctor and multiple family members including spouse.    _______________________________ R. Roetta Sessions, MD FACP [facility] eSigned:  R. Roetta Sessions, MD FACP [facility] 04/23/2012 7:47 PM     CC:  PATIENT NAME:  [name], [name] MR#: [id number]

## 2012-04-23 NOTE — ED Notes (Signed)
Able to take meds and fluids w/out difficulty.

## 2012-04-23 NOTE — Progress Notes (Signed)
1945  Procedure complete.  Pt has history of cardiac problems.  Heart rate up to the 150-170s.  Tachycardia with VT noted on the monitor.  Hear rate down to 110 after procedure complete but still having PVC's.  Transferred to ER for continued monitoring per order of Dr. Jena Gauss.

## 2012-04-23 NOTE — ED Notes (Signed)
Pt presents with c/o of difficultly swallowing since yesterday, hx of same, states unable to swallow anything that stays down

## 2012-04-23 NOTE — ED Notes (Signed)
Inability to swallow began at 2100 last pm during supper.  Describes as "food gets about half way down and won't go any further."  Tried to take fluids this AM w/out success.  Has had none of his meds.

## 2012-04-23 NOTE — H&P (Signed)
Primary Care Physician:  Alice Reichert, MD Primary Gastroenterologist:  Dr. Jena Gauss  Pre-Procedure History & Physical: HPI:  [name] is a 71 y.o. male here for urgent evaluation of esophageal dysphagia-unable to swallow since attempting to swallow some fried chicken yesterday. Since that time has been unable to swallow even his oral secretions. Presented to the ED and saw Dr. Lynelle Doctor.  blood sugar 182. Low volume lungs chest x-ray chronic atelectasis. Dr. Lynelle Doctor called me to see the patient urgently. History of chronic esophageal dysphagia. Denies GERD symptoms. Long-standing smoker. No alcohol. No prior evaluation of dysphagia. No prior EGD, etc. ,  Past Medical History  Diagnosis Date  . COPD (chronic obstructive pulmonary disease)   . Diabetes mellitus   . Spinal disease   . Myocardial infarct     Past Surgical History  Procedure Date  . Back surgery     Prior to Admission medications   Medication Sig Start Date End Date Taking? Authorizing Provider  albuterol (PROVENTIL HFA;VENTOLIN HFA) 108 (90 BASE) MCG/ACT inhaler Inhale 2 puffs into the lungs every 6 (six) hours as needed. For rescue-shortness of breath   Yes Historical Provider, MD  ALPRAZolam (XANAX) 0.5 MG tablet Take 0.5 mg by mouth 3 (three) times daily as needed. anxiety    Yes Historical Provider, MD  benazepril (LOTENSIN) 10 MG tablet Take 10 mg by mouth every morning. 04/15/12  Yes Gaylord Shih, MD  budesonide-formoterol (SYMBICORT) 160-4.5 MCG/ACT inhaler Inhale 2 puffs into the lungs 2 (two) times daily. 06/06/11 06/05/12 Yes Angus Edilia Bo, MD  cetirizine (ZYRTEC) 10 MG tablet Take 10 mg by mouth daily as needed. For allergy/itching   Yes Historical Provider, MD  diltiazem (DILACOR XR) 180 MG 24 hr capsule Take 180 mg by mouth every morning.    Yes Historical Provider, MD  furosemide (LASIX) 20 MG tablet Take 20 mg by mouth every morning.    Yes Historical Provider, MD  gabapentin (NEURONTIN) 100 MG capsule  Take 100 mg by mouth every evening.  12/09/11  Yes Historical Provider, MD  hydrocortisone valerate cream (WESTCORT) 0.2 % Apply 1 application topically daily.  10/07/11  Yes Historical Provider, MD  hydrOXYzine (ATARAX/VISTARIL) 25 MG tablet Take 50 mg by mouth 2 (two) times daily.  12/09/11  Yes Historical Provider, MD  meclizine (ANTIVERT) 25 MG tablet Take 25 mg by mouth 2 (two) times daily.     Yes Historical Provider, MD  oxyCODONE-acetaminophen (PERCOCET) 7.5-500 MG per tablet Take 1 tablet by mouth 3 (three) times daily as needed. pain    Yes Historical Provider, MD  sertraline (ZOLOFT) 100 MG tablet Take 100 mg by mouth every morning.    Yes Historical Provider, MD  simvastatin (ZOCOR) 40 MG tablet Take 1 tablet (40 mg total) by mouth at bedtime. 12/02/11  Yes Gaylord Shih, MD  tiotropium (SPIRIVA) 18 MCG inhalation capsule Place 18 mcg into inhaler and inhale every morning. 06/06/11 06/05/12 Yes Angus Edilia Bo, MD  traMADol (ULTRAM) 50 MG tablet Take 100 mg by mouth 2 (two) times daily as needed. pain    Yes Historical Provider, MD  metFORMIN (GLUCOPHAGE) 1000 MG tablet Take 1 tablet (1,000 mg total) by mouth 2 (two) times daily with a meal. 03/25/11 03/24/12  Gaylord Shih, MD    Allergies as of 04/23/2012 - Review Complete 04/23/2012  Allergen Reaction Noted  . Codeine  11/10/2008    Family History  Problem Relation Age of Onset  . COPD Father  History   Social History  . Marital Status: Married    Spouse Name: N/A    Number of Children: N/A  . Years of Education: N/A   Occupational History  . Not on file.   Social History Main Topics  . Smoking status: Former Games developer  . Smokeless tobacco: Not on file  . Alcohol Use: No  . Drug Use: No  . Sexually Active: Not on file   Other Topics Concern  . Not on file   Social History Narrative  . No narrative on file    Review of Systems: See HPI, otherwise negative ROS  Physical Exam: BP 126/75  Pulse 94  Temp 97.6  F (36.4 C) (Oral)  Resp 16  Ht 6' (1.829 m)  Wt 200 lb (90.719 kg)  BMI 27.12 kg/m2  SpO2 95% General:    Elderly bearded gentleman in no acute distress. Spitting out his oral secretions. Skin:  Intact without significant lesions or rashes. Eyes:  Sclera clear, no icterus.   Conjunctiva pink. Ears:  Normal auditory acuity. Nose:  No deformity, discharge,  or lesions. Mouth:  No deformity or lesions. Neck:  Supple; no masses or thyromegaly. No significant cervical adenopathy. Lungs:  Clear but poor respiratory effort..   No wheezes, crackles, or rhonchi. No acute distress. Heart:  Regular rate and rhythm; no murmurs, clicks, rubs,  or gallops. Abdomen: Non-distended, normal bowel sounds.  Soft and nontender without appreciable mass or hepatosplenomegaly.  Pulses:  Normal pulses noted. Extremities:  Without clubbing or edema.  Impression/Plan:  71 year old gentleman with multiple comorbidities with chronic esophageal dysphagia now with a clinical presentation compelling for an esophageal food impaction.  Urgent EGD with esophagitis disimpaction as feasible now being undertaken.  Patient understands  I will be unable to dilate him in this session if he has in his upper GI tract; I explained to him he'll need to come back electively and have a complete EGD with esophageal dilation as appropriate.The risks, benefits, limitations, alternatives and imponderables have been reviewed with the patient. Potential for esophageal dilation, biopsy, etc. have also been reviewed.  Questions have been answered. All parties agreeable.

## 2012-04-26 ENCOUNTER — Encounter (HOSPITAL_COMMUNITY): Payer: Self-pay | Admitting: Internal Medicine

## 2012-05-04 ENCOUNTER — Telehealth: Payer: Self-pay | Admitting: Internal Medicine

## 2012-05-04 NOTE — Telephone Encounter (Signed)
I have LMOM for patient to return my call to schedule 

## 2012-05-04 NOTE — Telephone Encounter (Signed)
Message copied by Glendora Score on Mon May 04, 2012  8:45 AM ------      Message from: Corbin Ade      Created: Sun May 03, 2012  3:46 PM       pT NEEDS TO RETURN FOR REPEAT EGD FOR ED IF NOT ALREADY SCHEDULED

## 2012-06-08 ENCOUNTER — Other Ambulatory Visit: Payer: Self-pay | Admitting: *Deleted

## 2012-06-08 MED ORDER — SIMVASTATIN 40 MG PO TABS: 40.0000 mg | ORAL_TABLET | Freq: Every day | ORAL | Status: DC

## 2012-07-14 ENCOUNTER — Other Ambulatory Visit: Payer: Self-pay | Admitting: Cardiology

## 2013-01-11 ENCOUNTER — Ambulatory Visit: Payer: Medicare Other | Admitting: Cardiology

## 2013-02-03 ENCOUNTER — Encounter: Payer: Self-pay | Admitting: Cardiology

## 2013-02-03 ENCOUNTER — Ambulatory Visit (INDEPENDENT_AMBULATORY_CARE_PROVIDER_SITE_OTHER): Payer: Medicare Other | Admitting: Cardiology

## 2013-02-03 VITALS — BP 136/64 | HR 93 | Ht 70.0 in

## 2013-02-03 DIAGNOSIS — I2510 Atherosclerotic heart disease of native coronary artery without angina pectoris: Secondary | ICD-10-CM

## 2013-02-03 DIAGNOSIS — E785 Hyperlipidemia, unspecified: Secondary | ICD-10-CM

## 2013-02-03 NOTE — Progress Notes (Signed)
Clinical Summary Mr. Harkleroad is a 72 y.o.male 1. CAD - prior MI approx 20 years ago at [facility], records unclear. No history of PCI - denies any chest chest pain. Gradual worsening of SOB over last 6-7 months, chronically sleeps in hospital bed, has not had to increase incline. Denies any LE edema - sedentary lifestyle b/c of ankylosing spondylisits/chronic back pain, does not exert himself - Last echo Jan 2013 LVEF 50-55%  - compliant w/ meds: benezepril, simva 40. Not on ASA, he is not sure why. Denies any history of bleeding. Not on beta blocker presumably due to COPD, seems on diltiazem for antianginal.   2. Severe COPD -home O2, chronic CO2 retainer. Compliant w/ home O2, mixed compliance w/ inhalers due to cost   3. Tobacco abuse - stopped smoking over a year now.  4.HL: compliant w/ statin. Reports PCP follows cholesterol, don't have recent labs.   5. HTN: pt denies history, though its documented on his problem list. On antihypertensive meds but unclear if just secondary to CAD. Reports compliance w/ these meds.  Past Medical History  Diagnosis Date  . COPD (chronic obstructive pulmonary disease)   . Diabetes mellitus   . Spinal disease   . Myocardial infarct   Esophageal stricture   Allergies  Allergen Reactions  . Codeine     REACTION-Unknown     Current Outpatient Prescriptions  Medication Sig Dispense Refill  . albuterol (PROVENTIL HFA;VENTOLIN HFA) 108 (90 BASE) MCG/ACT inhaler Inhale 2 puffs into the lungs every 6 (six) hours as needed. For rescue-shortness of breath      . ALPRAZolam (XANAX) 0.5 MG tablet Take 0.5 mg by mouth 3 (three) times daily as needed. anxiety       . benazepril (LOTENSIN) 10 MG tablet TAKE ONE TABLET DAILY.  90 tablet  3  . budesonide-formoterol (SYMBICORT) 160-4.5 MCG/ACT inhaler Inhale 2 puffs into the lungs 2 (two) times daily.  1 Inhaler  5  . cetirizine (ZYRTEC) 10 MG tablet Take 10 mg by mouth daily as needed. For allergy/itching       . diltiazem (DILACOR XR) 180 MG 24 hr capsule Take 180 mg by mouth every morning.       . furosemide (LASIX) 20 MG tablet Take 20 mg by mouth every morning.       . gabapentin (NEURONTIN) 100 MG capsule Take 100 mg by mouth every evening.       . hydrocortisone valerate cream (WESTCORT) 0.2 % Apply 1 application topically daily.       . hydrOXYzine (ATARAX/VISTARIL) 25 MG tablet Take 50 mg by mouth 2 (two) times daily.       . meclizine (ANTIVERT) 25 MG tablet Take 25 mg by mouth 2 (two) times daily.        . metFORMIN (GLUCOPHAGE) 1000 MG tablet Take 1 tablet (1,000 mg total) by mouth 2 (two) times daily with a meal.  90 tablet  3  . oxyCODONE-acetaminophen (PERCOCET) 7.5-500 MG per tablet Take 1 tablet by mouth 3 (three) times daily as needed. pain       . sertraline (ZOLOFT) 100 MG tablet Take 100 mg by mouth every morning.       . simvastatin (ZOCOR) 40 MG tablet Take 1 tablet (40 mg total) by mouth at bedtime.  30 tablet  11  . tiotropium (SPIRIVA) 18 MCG inhalation capsule Place 18 mcg into inhaler and inhale every morning.      . traMADol (ULTRAM) 50 MG tablet  Take 100 mg by mouth 2 (two) times daily as needed. pain        No current facility-administered medications for this visit.     Past Surgical History  Procedure Laterality Date  . Back surgery    . Foreign body removal  04/23/2012    Procedure: FOREIGN BODY REMOVAL;  Surgeon: Corbin Ade, MD;  Location: [facility];  Service: Endoscopy;  Laterality: N/A;     Allergies  Allergen Reactions  . Codeine     REACTION-Unknown      Family History  Problem Relation Age of Onset  . COPD Father      Social History Mr. Currier reports that he has quit smoking. He does not have any smokeless tobacco history on file. Mr. Norden reports that he does not drink alcohol.   Review of Systems MSK: chronic back pain. Otherwise 12 point ROS negative other than reported in HPI.   Physical Examination p 100 bp  136/64 Gen: NAD HEENT: sclera clear, pupils equal round and reactive CV: RRR, no m/r/g, no JVD. Barrell chest Pulm: CTAB Abd: soft, NT, ND Ext: warm, no edema Neuro: A&Ox3 Skin: warm, dry, flaking     Diagnostic Studies Jan 2013 Echo: LVEF 50-55%, akinesis basal inferior wall,     Assessment and Plan  1. CAD: no current symptoms. Continue risk factor modification and secondary prevention. I have asked him to start taking a baby ASA.  2. HL: followed by his PCP Dr Megan Mans by his report, no recent labs in our system. Recommend changing to high dose statin (atorva 80 mg) due to history of CAD and DM, consistent w/ new lipid guidelines.   3. Tobacco abuse: quit over 1 year ago, remains abstinent.   4. HTN: documneted in problem list, pt denies history. Unclear if on meds just due to cardiac history. Goal BP given DM is <130/80, pressure in clinic today right at goal. Continue to follow, can further titrate ACE-I if bp consistently above goal.   F/u 6 months   Antoine Poche, M.D., F.A.C.C.

## 2013-02-03 NOTE — Patient Instructions (Signed)
Your physician recommends that you schedule a follow-up appointment in: 6 Months with Dr Lurena Joiner will receive a reminder letter two months in advance reminding you to call and schedule your appointment. If you don't receive this letter, please contact our office.  Your physician recommends that you continue on your current medications as directed. Please refer to the Current Medication list given to you today.

## 2013-06-22 ENCOUNTER — Other Ambulatory Visit: Payer: Self-pay | Admitting: Cardiology

## 2013-09-17 ENCOUNTER — Emergency Department (HOSPITAL_COMMUNITY): Payer: Medicare Other

## 2013-09-17 ENCOUNTER — Inpatient Hospital Stay (HOSPITAL_COMMUNITY)
Admission: EM | Admit: 2013-09-17 | Discharge: 2013-09-22 | DRG: 189 | Disposition: A | Discharge status: Home Health | Payer: Medicare Other | Attending: Family Medicine | Admitting: Family Medicine

## 2013-09-17 ENCOUNTER — Encounter (HOSPITAL_COMMUNITY): Payer: Self-pay | Admitting: Emergency Medicine

## 2013-09-17 DIAGNOSIS — Z87891 Personal history of nicotine dependence: Secondary | ICD-10-CM

## 2013-09-17 DIAGNOSIS — R4182 Altered mental status, unspecified: Secondary | ICD-10-CM

## 2013-09-17 DIAGNOSIS — Z7401 Bed confinement status: Secondary | ICD-10-CM

## 2013-09-17 DIAGNOSIS — J9600 Acute respiratory failure, unspecified whether with hypoxia or hypercapnia: Principal | ICD-10-CM

## 2013-09-17 DIAGNOSIS — Z66 Do not resuscitate: Secondary | ICD-10-CM | POA: Diagnosis present

## 2013-09-17 DIAGNOSIS — I959 Hypotension, unspecified: Secondary | ICD-10-CM | POA: Diagnosis present

## 2013-09-17 DIAGNOSIS — J449 Chronic obstructive pulmonary disease, unspecified: Secondary | ICD-10-CM | POA: Diagnosis present

## 2013-09-17 DIAGNOSIS — G4730 Sleep apnea, unspecified: Secondary | ICD-10-CM

## 2013-09-17 DIAGNOSIS — M549 Dorsalgia, unspecified: Secondary | ICD-10-CM | POA: Diagnosis present

## 2013-09-17 DIAGNOSIS — J441 Chronic obstructive pulmonary disease with (acute) exacerbation: Secondary | ICD-10-CM

## 2013-09-17 DIAGNOSIS — M459 Ankylosing spondylitis of unspecified sites in spine: Secondary | ICD-10-CM | POA: Diagnosis present

## 2013-09-17 DIAGNOSIS — N390 Urinary tract infection, site not specified: Secondary | ICD-10-CM | POA: Diagnosis present

## 2013-09-17 DIAGNOSIS — R0902 Hypoxemia: Secondary | ICD-10-CM

## 2013-09-17 DIAGNOSIS — G8929 Other chronic pain: Secondary | ICD-10-CM | POA: Diagnosis present

## 2013-09-17 DIAGNOSIS — E785 Hyperlipidemia, unspecified: Secondary | ICD-10-CM

## 2013-09-17 DIAGNOSIS — I2510 Atherosclerotic heart disease of native coronary artery without angina pectoris: Secondary | ICD-10-CM

## 2013-09-17 DIAGNOSIS — M4722 Other spondylosis with radiculopathy, cervical region: Secondary | ICD-10-CM

## 2013-09-17 DIAGNOSIS — I252 Old myocardial infarction: Secondary | ICD-10-CM

## 2013-09-17 DIAGNOSIS — I214 Non-ST elevation (NSTEMI) myocardial infarction: Secondary | ICD-10-CM

## 2013-09-17 DIAGNOSIS — I5032 Chronic diastolic (congestive) heart failure: Secondary | ICD-10-CM

## 2013-09-17 DIAGNOSIS — Z885 Allergy status to narcotic agent status: Secondary | ICD-10-CM

## 2013-09-17 DIAGNOSIS — R0689 Other abnormalities of breathing: Secondary | ICD-10-CM

## 2013-09-17 DIAGNOSIS — E119 Type 2 diabetes mellitus without complications: Secondary | ICD-10-CM

## 2013-09-17 DIAGNOSIS — E7800 Pure hypercholesterolemia, unspecified: Secondary | ICD-10-CM | POA: Diagnosis present

## 2013-09-17 DIAGNOSIS — Z9981 Dependence on supplemental oxygen: Secondary | ICD-10-CM

## 2013-09-17 DIAGNOSIS — J9690 Respiratory failure, unspecified, unspecified whether with hypoxia or hypercapnia: Secondary | ICD-10-CM

## 2013-09-17 DIAGNOSIS — J4489 Other specified chronic obstructive pulmonary disease: Secondary | ICD-10-CM | POA: Diagnosis present

## 2013-09-17 DIAGNOSIS — I10 Essential (primary) hypertension: Secondary | ICD-10-CM

## 2013-09-17 DIAGNOSIS — Z79899 Other long term (current) drug therapy: Secondary | ICD-10-CM

## 2013-09-17 DIAGNOSIS — Z7982 Long term (current) use of aspirin: Secondary | ICD-10-CM

## 2013-09-17 LAB — HEPATIC FUNCTION PANEL
ALT: 11 U/L (ref 0–53)
AST: 15 U/L (ref 0–37)
Albumin: 3.9 g/dL (ref 3.5–5.2)
Alkaline Phosphatase: 65 U/L (ref 39–117)
Bilirubin, Direct: <0.2 mg/dL (ref 0.0–0.3)
TOTAL PROTEIN: 7.3 g/dL (ref 6.0–8.3)
Total Bilirubin: 0.2 mg/dL — ABNORMAL LOW (ref 0.3–1.2)

## 2013-09-17 LAB — BLOOD GAS, ARTERIAL
Acid-Base Excess: 5.3 mmol/L — ABNORMAL HIGH (ref 0.0–2.0)
Bicarbonate: 32.7 mEq/L — ABNORMAL HIGH (ref 20.0–24.0)
FIO2: 100 %
O2 Saturation: 99.1 %
PCO2 ART: 84.1 mmHg — AB (ref 35.0–45.0)
Patient temperature: 37
TCO2: 32.7 mmol/L (ref 0–100)
pH, Arterial: 7.214 — ABNORMAL LOW (ref 7.350–7.450)
pO2, Arterial: 241 mmHg — ABNORMAL HIGH (ref 80.0–100.0)

## 2013-09-17 LAB — BASIC METABOLIC PANEL
BUN: 16 mg/dL (ref 6–23)
CHLORIDE: 95 meq/L — AB (ref 96–112)
CO2: 36 mEq/L — ABNORMAL HIGH (ref 19–32)
Calcium: 9.3 mg/dL (ref 8.4–10.5)
Creatinine, Ser: 1.31 mg/dL (ref 0.50–1.35)
GFR calc Af Amer: 60 mL/min — ABNORMAL LOW (ref >90)
GFR calc non Af Amer: 52 mL/min — ABNORMAL LOW (ref >90)
GLUCOSE: 165 mg/dL — AB (ref 70–99)
POTASSIUM: 5.3 meq/L (ref 3.7–5.3)
SODIUM: 141 meq/L (ref 137–147)

## 2013-09-17 LAB — CBC WITH DIFFERENTIAL/PLATELET
Basophils Absolute: 0 10*3/uL (ref 0.0–0.1)
Basophils Relative: 0 % (ref 0–1)
Eosinophils Absolute: 0.2 10*3/uL (ref 0.0–0.7)
Eosinophils Relative: 2 % (ref 0–5)
HCT: 44.4 % (ref 39.0–52.0)
HEMOGLOBIN: 13.9 g/dL (ref 13.0–17.0)
LYMPHS ABS: 1.1 10*3/uL (ref 0.7–4.0)
LYMPHS PCT: 10 % — AB (ref 12–46)
MCH: 31.2 pg (ref 26.0–34.0)
MCHC: 31.3 g/dL (ref 30.0–36.0)
MCV: 99.6 fL (ref 78.0–100.0)
MONOS PCT: 6 % (ref 3–12)
Monocytes Absolute: 0.6 10*3/uL (ref 0.1–1.0)
NEUTROS ABS: 8.8 10*3/uL — AB (ref 1.7–7.7)
NEUTROS PCT: 82 % — AB (ref 43–77)
Platelets: 194 10*3/uL (ref 150–400)
RBC: 4.46 MIL/uL (ref 4.22–5.81)
RDW: 14.4 % (ref 11.5–15.5)
WBC: 10.7 10*3/uL — AB (ref 4.0–10.5)

## 2013-09-17 LAB — TROPONIN I: Troponin I: 0.69 ng/mL (ref <0.30)

## 2013-09-17 LAB — I-STAT CG4 LACTIC ACID, ED: LACTIC ACID, VENOUS: 2.2 mmol/L (ref 0.5–2.2)

## 2013-09-17 MED ORDER — IPRATROPIUM BROMIDE 0.02 % IN SOLN
0.5000 mg | Freq: Once | RESPIRATORY_TRACT | Status: AC
  Administered 2013-09-18: 0.5 mg via RESPIRATORY_TRACT
  Filled 2013-09-17: qty 2.5

## 2013-09-17 MED ORDER — SODIUM CHLORIDE 0.9 % IV BOLUS (SEPSIS)
1000.0000 mL | Freq: Once | INTRAVENOUS | Status: AC
  Administered 2013-09-17: 1000 mL via INTRAVENOUS

## 2013-09-17 MED ORDER — ALBUTEROL SULFATE (2.5 MG/3ML) 0.083% IN NEBU
5.0000 mg | INHALATION_SOLUTION | Freq: Once | RESPIRATORY_TRACT | Status: AC
  Administered 2013-09-18: 5 mg via RESPIRATORY_TRACT
  Filled 2013-09-17: qty 6

## 2013-09-17 NOTE — ED Notes (Signed)
Pt had went to arthritis dr and told wbc are high. Per family pt is more altered than normal.

## 2013-09-17 NOTE — ED Notes (Signed)
CRITICAL VALUE ALERT  Critical value received:  Troponin 0.69  Date of notification:  09/17/13  Time of notification:  2220  Critical value read back:yes  Nurse who received alert:  Bronson CurbKristy Alexxus Sobh, Rn  MD notified (1st page):  Dr Lynelle Doctorknapp  Time of first page:  2220  MD notified (2nd page):  Time of second page:  Responding MD:  Dr Lynelle Doctorknapp  Time MD responded:  2220

## 2013-09-17 NOTE — ED Provider Notes (Signed)
CSN: [id number]     Arrival date & time 09/17/13  2047 History  This chart was scribed for Ward Givens, MD by Dorothey Baseman, ED Scribe. This patient was seen in room [facility]14/[facility]14 and the patient's care was started at 9:45 PM.    Chief Complaint  Patient presents with  . Abnormal Lab  . Altered Mental Status   The history is provided by the spouse. No language interpreter was used.   Level 5 Caveat- Altered Mental Status  HPI Comments: [name] is a 73 y.o. male who presents to the Emergency Department complaining of altered mental status, including confusion, speech difficulty, trouble swallowing, and decreased responsiveness onset last night. His wife reports that the patient has not been able to eat or drink on his own today with some associated diffuse weakness. She denies noticing any particular hemiparesis. His wife reports that the patient has not been able to stand or walk on his own secondary to his ankylosis spondylosis, but that his current symptoms are worse than his usual baseline. She states she has not walked for several years and he has been an able to stand with changing positions for at least a year She reports an associated cough for the past few days, but denies any today. His wife reports that the patient was evaluated by his rheumatologist 5 days ago and received labs, which she was notified today indicated an elevated WBC. She denies fever. Wife states he seems confused and is responding slower today. She states he's sleeping a lot. She states he has worsening of his jerking and appears to be more uncoordinated. She states normally he is able to hold up his own cup to drink it he is unable to do that today. She denies any coughing today. He has had a lot of coughing in the past. He is chronically on oxygen 3 and half liters per minute. Patient is prescribed Xanax, Percocet, Neurontin, and Ultram daily for his chronic pain. Patient has an allergy to codeine. Patient has a  history of COPD (patient is on 3.5 L of oxygen at home), DM, and MI.   Wife reports she was wanting to get some home health nursing when she talked to her PCP this week, however he stated it wasn't needed.  PCP Dr Renard Matter  Past Medical History  Diagnosis Date  . COPD (chronic obstructive pulmonary disease)   . Diabetes mellitus   . Spinal disease   . Myocardial infarct    Past Surgical History  Procedure Laterality Date  . Back surgery    . Foreign body removal  04/23/2012    Procedure: FOREIGN BODY REMOVAL;  Surgeon: Corbin Ade, MD;  Location: [facility];  Service: Endoscopy;  Laterality: N/A;   Family History  Problem Relation Age of Onset  . COPD Father    History  Substance Use Topics  . Smoking status: Former Games developer  . Smokeless tobacco: Not on file  . Alcohol Use: No  lives at home Lives with spouse Wears oxygen 3-1/2 L a minute nasal cannula  Review of Systems  Unable to perform ROS Constitutional: Negative for fever.    Allergies  Codeine  Home Medications   Prior to Admission medications   Medication Sig Start Date End Date Taking? Authorizing Provider  albuterol (PROVENTIL HFA;VENTOLIN HFA) 108 (90 BASE) MCG/ACT inhaler Inhale 2 puffs into the lungs every 6 (six) hours as needed. For rescue-shortness of breath   Yes Historical Provider, MD  ALPRAZolam (  XANAX) 0.5 MG tablet Take 0.5 mg by mouth 3 (three) times daily as needed. anxiety    Yes Historical Provider, MD  benazepril (LOTENSIN) 10 MG tablet Take 10 mg by mouth every morning.   Yes Historical Provider, MD  budesonide-formoterol (SYMBICORT) 160-4.5 MCG/ACT inhaler Inhale 2 puffs into the lungs 2 (two) times daily as needed. 06/06/11 09/17/13 Yes Angus Edilia BoG McInnis, MD  diltiazem (DILACOR XR) 180 MG 24 hr capsule Take 180 mg by mouth every morning.    Yes Historical Provider, MD  DIPHENHYDRAMINE HCL, TOPICAL, (BENADRYL ITCH STOPPING) 2 % GEL Apply 1 application topically daily as needed.   Yes  Historical Provider, MD  gabapentin (NEURONTIN) 100 MG capsule Take 100 mg by mouth 3 (three) times daily.  12/09/11  Yes Historical Provider, MD  metFORMIN (GLUCOPHAGE) 1000 MG tablet Take 1 tablet (1,000 mg total) by mouth 2 (two) times daily with a meal. 03/25/11 09/17/13 Yes Gaylord Shihhomas C Wall, MD  nystatin cream (MYCOSTATIN) Apply 1 application topically daily as needed for dry skin.   Yes Historical Provider, MD  oxyCODONE-acetaminophen (PERCOCET) 7.5-325 MG per tablet Take 1 tablet by mouth 3 (three) times daily as needed for pain.   Yes Historical Provider, MD  sertraline (ZOLOFT) 100 MG tablet Take 100 mg by mouth every morning.    Yes Historical Provider, MD  simvastatin (ZOCOR) 40 MG tablet Take 40 mg by mouth at bedtime.   Yes Historical Provider, MD  tiotropium (SPIRIVA) 18 MCG inhalation capsule Place 18 mcg into inhaler and inhale daily as needed (for shortness of breath).  06/06/11 09/17/13 Yes Angus Edilia BoG McInnis, MD  traMADol (ULTRAM) 50 MG tablet Take 100 mg by mouth 2 (two) times daily as needed. pain    Yes Historical Provider, MD   Triage Vitals: BP 91/53  Pulse 89  Temp(Src) 98.4 F (36.9 C) (Oral)  Resp 16  Ht 6' (1.829 m)  SpO2 99%  Physical Exam  Nursing note and vitals reviewed. Constitutional: He appears well-developed and well-nourished. He is sleeping.  HENT:  Head: Normocephalic and atraumatic.  Right Ear: External ear normal.  Left Ear: External ear normal.  Nose: Nose normal. No mucosal edema or rhinorrhea.  Mouth/Throat: Oropharynx is clear and moist and mucous membranes are normal. No dental abscesses or uvula swelling.  Eyes: Conjunctivae and EOM are normal. Pupils are equal, round, and reactive to light.  Neck: Normal range of motion and full passive range of motion without pain. Neck supple.  Cardiovascular: Normal rate, regular rhythm and normal heart sounds.  Exam reveals no gallop and no friction rub.   No murmur heard. Pulmonary/Chest: Effort normal. No  respiratory distress. He has decreased breath sounds. He has no wheezes. He has no rhonchi. He has no rales. He exhibits no tenderness and no crepitus.  Abdominal: Soft. Normal appearance and bowel sounds are normal. He exhibits no distension. There is no tenderness. There is no rebound and no guarding.  Musculoskeletal: Normal range of motion. He exhibits no edema and no tenderness.  Neurological: He has normal strength. No cranial nerve deficit.  Diffusely weak  Skin: Skin is warm, dry and intact. No rash noted. No erythema. No pallor.  Psychiatric: His speech is delayed. He is slowed.    ED Course  Procedures (including critical care time)  Medications  levofloxacin (LEVAQUIN) IVPB 500 mg (500 mg Intravenous New Bag/Given 09/18/13 0040)  sodium chloride 0.9 % bolus 1,000 mL (0 mLs Intravenous Stopped 09/18/13 0040)  albuterol (PROVENTIL) (2.5 MG/3ML)  0.083% nebulizer solution 5 mg (5 mg Nebulization Given 09/18/13 0009)  ipratropium (ATROVENT) nebulizer solution 0.5 mg (0.5 mg Nebulization Given 09/18/13 0009)  methylPREDNISolone sodium succinate (SOLU-MEDROL) 125 mg/2 mL injection 125 mg (125 mg Intravenous Given 09/18/13 0040)    DIAGNOSTIC STUDIES: Oxygen Saturation is 99% on room air, normal by my interpretation.    COORDINATION OF CARE: 9:28 PM- Ordered EKG, CBC, BMP, troponin, and a chest x-ray.   Patient was given a liter bolus of fluids for his hypotension.  10:00 PM- Ordered I-Stat CG4 lactic acid, UA, hepatic function panel, and a head CT. Ordered IV fluids to manage symptoms. Discussed treatment plan with patient at bedside and patient verbalized agreement.   2300 nurses report patient's oxygen dropped to 80% on 6 L per minute nasal cannula. He was placed in a non-breather mask. When I checked the patient he now has some scattered wheezing mainly on his right side. I talked to the patient's wife and his daughter who is a Therapist, musichospice nurse. They have decided they did not want him to  be intubated. The wife has signed papers while in the ED. At this point patient was placed on BiPAP and he was given albuterol nebulizer treatment. A blood gas does show he has acute respiratory acidosis. Patient was given IV steroids and started on IV antibiotics. Wife is very clear that he got much less pain medicine today than he normally does. She was asking for him to have pain medicine however she also admits he's not complaining of pain.  Recheck at 2400 patient more awake now, he states "get this thing off of me" and is trying to pull the BiPAP mask off. This was the first clear sentence he had spoken while in the ED. Nurses have been unable to get a urine sample.  00:45 Dr Rito EhrlichKrishnan, admit to step down   EKG Interpretation   Date/Time:  Friday September 17 2013 21:37:03 EDT Ventricular Rate:  89 PR Interval:  162 QRS Duration: 118 QT Interval:  372 QTC Calculation: 452 R Axis:   91 Text Interpretation:  Normal sinus rhythm Rightward axis Inferior infarct  (cited on or before 01-Jun-2011) When compared with ECG of 05-Jun-2011  04:47, Non-specific change in ST segment in Inferior leads T wave  inversion no longer evident in Inferior leads Confirmed by Keymora Grillot  MD-I,  Kongmeng Santoro ([id number]) on 09/17/2013 9:59:18 PM      MDM   Final diagnoses:  Respiratory failure  COPD with exacerbation  Altered mental status   Plan admission  CRITICAL CARE Performed by: Juandavid Dallman L Janne Faulk Total critical care time: 38 min Critical care time was exclusive of separately billable procedures and treating other patients. Critical care was necessary to treat or prevent imminent or life-threatening deterioration. Critical care was time spent personally by me on the following activities: development of treatment plan with patient and/or surrogate as well as nursing, discussions with consultants, evaluation of patient's response to treatment, examination of patient, obtaining history from patient or surrogate, ordering and  performing treatments and interventions, ordering and review of laboratory studies, ordering and review of radiographic studies, pulse oximetry and re-evaluation of patient's condition.   I personally performed the services described in this documentation, which was scribed in my presence. The recorded information has been reviewed and considered.  Devoria AlbeIva Ulyana Pitones, MD, Armando GangFACEP     Ward GivensIva L Sante Biedermann, MD 09/18/13 [phone]

## 2013-09-18 ENCOUNTER — Encounter (HOSPITAL_COMMUNITY): Payer: Self-pay | Admitting: Internal Medicine

## 2013-09-18 DIAGNOSIS — R0689 Other abnormalities of breathing: Secondary | ICD-10-CM | POA: Diagnosis present

## 2013-09-18 DIAGNOSIS — J9600 Acute respiratory failure, unspecified whether with hypoxia or hypercapnia: Secondary | ICD-10-CM | POA: Diagnosis present

## 2013-09-18 DIAGNOSIS — E119 Type 2 diabetes mellitus without complications: Secondary | ICD-10-CM

## 2013-09-18 DIAGNOSIS — J441 Chronic obstructive pulmonary disease with (acute) exacerbation: Secondary | ICD-10-CM

## 2013-09-18 DIAGNOSIS — I214 Non-ST elevation (NSTEMI) myocardial infarction: Secondary | ICD-10-CM

## 2013-09-18 LAB — COMPREHENSIVE METABOLIC PANEL
ALT: 10 U/L (ref 0–53)
AST: 13 U/L (ref 0–37)
Albumin: 3.8 g/dL (ref 3.5–5.2)
Alkaline Phosphatase: 55 U/L (ref 39–117)
BILIRUBIN TOTAL: 0.2 mg/dL — AB (ref 0.3–1.2)
BUN: 19 mg/dL (ref 6–23)
CALCIUM: 8.8 mg/dL (ref 8.4–10.5)
CO2: 30 meq/L (ref 19–32)
CREATININE: 0.98 mg/dL (ref 0.50–1.35)
Chloride: 93 mEq/L — ABNORMAL LOW (ref 96–112)
GFR calc non Af Amer: 79 mL/min — ABNORMAL LOW (ref >90)
GLUCOSE: 246 mg/dL — AB (ref 70–99)
Potassium: 5.6 mEq/L — ABNORMAL HIGH (ref 3.7–5.3)
Sodium: 137 mEq/L (ref 137–147)
Total Protein: 7.3 g/dL (ref 6.0–8.3)

## 2013-09-18 LAB — URINE MICROSCOPIC-ADD ON

## 2013-09-18 LAB — GLUCOSE, CAPILLARY
GLUCOSE-CAPILLARY: 240 mg/dL — AB (ref 70–99)
GLUCOSE-CAPILLARY: 231 mg/dL — AB (ref 70–99)
GLUCOSE-CAPILLARY: 251 mg/dL — AB (ref 70–99)
Glucose-Capillary: 236 mg/dL — ABNORMAL HIGH (ref 70–99)
Glucose-Capillary: 262 mg/dL — ABNORMAL HIGH (ref 70–99)
Glucose-Capillary: 263 mg/dL — ABNORMAL HIGH (ref 70–99)

## 2013-09-18 LAB — BLOOD GAS, ARTERIAL
ACID-BASE EXCESS: 5.2 mmol/L — AB (ref 0.0–2.0)
Acid-Base Excess: 4.5 mmol/L — ABNORMAL HIGH (ref 0.0–2.0)
Bicarbonate: 30.9 mEq/L — ABNORMAL HIGH (ref 20.0–24.0)
Bicarbonate: 29.9 mEq/L — ABNORMAL HIGH (ref 20.0–24.0)
DELIVERY SYSTEMS: POSITIVE
Drawn by: 38235
Expiratory PAP: 6
FIO2: 0.4 %
Inspiratory PAP: 12
LHR: 10 {breaths}/min
O2 Content: 5 L/min
O2 SAT: 92 %
O2 Saturation: 90 %
PATIENT TEMPERATURE: 37
PCO2 ART: 50.8 mmHg — AB (ref 35.0–45.0)
PH ART: 7.274 — AB (ref 7.350–7.450)
PO2 ART: 60 mmHg — AB (ref 80.0–100.0)
Patient temperature: 37
TCO2: 28.5 mmol/L (ref 0–100)
TCO2: 27 mmol/L (ref 0–100)
pCO2 arterial: 68.8 mmHg (ref 35.0–45.0)
pH, Arterial: 7.389 (ref 7.350–7.450)
pO2, Arterial: 71.5 mmHg — ABNORMAL LOW (ref 80.0–100.0)

## 2013-09-18 LAB — URINALYSIS, ROUTINE W REFLEX MICROSCOPIC
Glucose, UA: 100 mg/dL — AB
NITRITE: POSITIVE — AB
PH: 5 (ref 5.0–8.0)
Specific Gravity, Urine: >1.03 — ABNORMAL HIGH (ref 1.005–1.030)
Urobilinogen, UA: 0.2 mg/dL (ref 0.0–1.0)

## 2013-09-18 LAB — TROPONIN I
TROPONIN I: 0.66 ng/mL — AB (ref <0.30)
TROPONIN I: 0.82 ng/mL — AB (ref <0.30)
Troponin I: 0.96 ng/mL (ref <0.30)
Troponin I: 0.55 ng/mL (ref <0.30)

## 2013-09-18 LAB — CBC
HCT: 41.7 % (ref 39.0–52.0)
HEMOGLOBIN: 12.9 g/dL — AB (ref 13.0–17.0)
MCH: 30.9 pg (ref 26.0–34.0)
MCHC: 30.9 g/dL (ref 30.0–36.0)
MCV: 99.8 fL (ref 78.0–100.0)
Platelets: 175 10*3/uL (ref 150–400)
RBC: 4.18 MIL/uL — AB (ref 4.22–5.81)
RDW: 14.5 % (ref 11.5–15.5)
WBC: 8.8 10*3/uL (ref 4.0–10.5)

## 2013-09-18 LAB — HEPARIN LEVEL (UNFRACTIONATED)
HEPARIN UNFRACTIONATED: 0.13 [IU]/mL — AB (ref 0.30–0.70)
HEPARIN UNFRACTIONATED: 0.26 [IU]/mL — AB (ref 0.30–0.70)

## 2013-09-18 LAB — MRSA PCR SCREENING: MRSA by PCR: NEGATIVE

## 2013-09-18 LAB — HEMOGLOBIN A1C
Hgb A1c MFr Bld: 7.3 % — ABNORMAL HIGH (ref <5.7)
MEAN PLASMA GLUCOSE: 163 mg/dL — AB (ref <117)

## 2013-09-18 MED ORDER — SERTRALINE HCL 50 MG PO TABS
100.0000 mg | ORAL_TABLET | Freq: Every morning | ORAL | Status: DC
  Administered 2013-09-18 – 2013-09-22 (×5): 100 mg via ORAL
  Filled 2013-09-18 (×5): qty 2

## 2013-09-18 MED ORDER — SODIUM CHLORIDE 0.9 % IJ SOLN
3.0000 mL | Freq: Two times a day (BID) | INTRAMUSCULAR | Status: DC
  Administered 2013-09-18 – 2013-09-22 (×6): 3 mL via INTRAVENOUS

## 2013-09-18 MED ORDER — HEPARIN BOLUS VIA INFUSION
4000.0000 [IU] | Freq: Once | INTRAVENOUS | Status: AC
  Administered 2013-09-18: 4000 [IU] via INTRAVENOUS
  Filled 2013-09-18: qty 4000

## 2013-09-18 MED ORDER — ALBUTEROL SULFATE (2.5 MG/3ML) 0.083% IN NEBU: 2.5000 mg | INHALATION_SOLUTION | Freq: Four times a day (QID) | RESPIRATORY_TRACT | Status: DC

## 2013-09-18 MED ORDER — ONDANSETRON HCL 4 MG PO TABS: 4.0000 mg | ORAL_TABLET | Freq: Four times a day (QID) | ORAL | Status: DC | PRN

## 2013-09-18 MED ORDER — ASPIRIN 300 MG RE SUPP
300.0000 mg | Freq: Every day | RECTAL | Status: DC
  Administered 2013-09-18: 300 mg via RECTAL
  Filled 2013-09-18 (×2): qty 1

## 2013-09-18 MED ORDER — ACETAMINOPHEN 325 MG PO TABS
650.0000 mg | ORAL_TABLET | Freq: Four times a day (QID) | ORAL | Status: DC | PRN
Start: 2013-09-18 — End: 2013-09-22

## 2013-09-18 MED ORDER — LEVOFLOXACIN IN D5W 500 MG/100ML IV SOLN
500.0000 mg | INTRAVENOUS | Status: DC
  Administered 2013-09-19 – 2013-09-21 (×3): 500 mg via INTRAVENOUS
  Filled 2013-09-18 (×4): qty 100

## 2013-09-18 MED ORDER — METHYLPREDNISOLONE SODIUM SUCC 125 MG IJ SOLR
125.0000 mg | Freq: Once | INTRAMUSCULAR | Status: AC
  Administered 2013-09-18: 125 mg via INTRAVENOUS
  Filled 2013-09-18: qty 2

## 2013-09-18 MED ORDER — MORPHINE SULFATE 2 MG/ML IJ SOLN
0.5000 mg | INTRAMUSCULAR | Status: DC | PRN
  Administered 2013-09-18 – 2013-09-19 (×3): 0.5 mg via INTRAVENOUS
  Filled 2013-09-18 (×3): qty 1

## 2013-09-18 MED ORDER — LEVOFLOXACIN IN D5W 500 MG/100ML IV SOLN
500.0000 mg | Freq: Once | INTRAVENOUS | Status: AC
  Administered 2013-09-18: 500 mg via INTRAVENOUS
  Filled 2013-09-18: qty 100

## 2013-09-18 MED ORDER — IPRATROPIUM-ALBUTEROL 0.5-2.5 (3) MG/3ML IN SOLN
3.0000 mL | Freq: Four times a day (QID) | RESPIRATORY_TRACT | Status: DC
  Administered 2013-09-18 – 2013-09-21 (×14): 3 mL via RESPIRATORY_TRACT
  Filled 2013-09-18 (×13): qty 3

## 2013-09-18 MED ORDER — DILTIAZEM HCL ER COATED BEADS 180 MG PO CP24
180.0000 mg | ORAL_CAPSULE | Freq: Every morning | ORAL | Status: DC
  Administered 2013-09-18 – 2013-09-22 (×5): 180 mg via ORAL
  Filled 2013-09-18 (×8): qty 1

## 2013-09-18 MED ORDER — ONDANSETRON HCL 4 MG/2ML IJ SOLN: 4.0000 mg | Freq: Four times a day (QID) | INTRAMUSCULAR | Status: DC | PRN

## 2013-09-18 MED ORDER — BUDESONIDE-FORMOTEROL FUMARATE 160-4.5 MCG/ACT IN AERO
2.0000 | INHALATION_SPRAY | Freq: Two times a day (BID) | RESPIRATORY_TRACT | Status: DC
  Administered 2013-09-18 – 2013-09-22 (×8): 2 via RESPIRATORY_TRACT
  Filled 2013-09-18 (×2): qty 6

## 2013-09-18 MED ORDER — IPRATROPIUM-ALBUTEROL 0.5-2.5 (3) MG/3ML IN SOLN
0.5000 mg | Freq: Four times a day (QID) | RESPIRATORY_TRACT | Status: DC
  Filled 2013-09-18: qty 3

## 2013-09-18 MED ORDER — INSULIN ASPART 100 UNIT/ML ~~LOC~~ SOLN
0.0000 [IU] | SUBCUTANEOUS | Status: DC
  Administered 2013-09-18: 5 [IU] via SUBCUTANEOUS
  Administered 2013-09-18: 8 [IU] via SUBCUTANEOUS
  Administered 2013-09-18: 5 [IU] via SUBCUTANEOUS
  Administered 2013-09-18: 8 [IU] via SUBCUTANEOUS
  Administered 2013-09-18: 5 [IU] via SUBCUTANEOUS
  Administered 2013-09-19: 11 [IU] via SUBCUTANEOUS
  Administered 2013-09-19 (×2): 8 [IU] via SUBCUTANEOUS
  Administered 2013-09-19: 11 [IU] via SUBCUTANEOUS
  Administered 2013-09-19: 8 [IU] via SUBCUTANEOUS
  Administered 2013-09-19 – 2013-09-20 (×2): 5 [IU] via SUBCUTANEOUS
  Administered 2013-09-20 – 2013-09-21 (×5): 8 [IU] via SUBCUTANEOUS
  Administered 2013-09-21: 3 [IU] via SUBCUTANEOUS
  Administered 2013-09-21 (×2): 8 [IU] via SUBCUTANEOUS

## 2013-09-18 MED ORDER — SIMVASTATIN 20 MG PO TABS
40.0000 mg | ORAL_TABLET | Freq: Every day | ORAL | Status: DC
  Administered 2013-09-18 – 2013-09-21 (×4): 40 mg via ORAL
  Filled 2013-09-18 (×4): qty 2

## 2013-09-18 MED ORDER — SODIUM CHLORIDE 0.9 % IV SOLN
INTRAVENOUS | Status: AC
  Administered 2013-09-18: 02:00:00 via INTRAVENOUS

## 2013-09-18 MED ORDER — ACETAMINOPHEN 650 MG RE SUPP
650.0000 mg | Freq: Four times a day (QID) | RECTAL | Status: DC | PRN
Start: 2013-09-18 — End: 2013-09-22
  Administered 2013-09-21: 650 mg via RECTAL
  Filled 2013-09-18 (×2): qty 1

## 2013-09-18 MED ORDER — HEPARIN (PORCINE) IN NACL 100-0.45 UNIT/ML-% IJ SOLN
1800.0000 [IU]/h | INTRAMUSCULAR | Status: DC
  Administered 2013-09-18: 1000 [IU]/h via INTRAVENOUS
  Administered 2013-09-18: 1500 [IU]/h via INTRAVENOUS
  Administered 2013-09-19: 1700 [IU]/h via INTRAVENOUS
  Administered 2013-09-20: 1800 [IU]/h via INTRAVENOUS
  Filled 2013-09-18 (×6): qty 250

## 2013-09-18 MED ORDER — DOCUSATE SODIUM 100 MG PO CAPS
100.0000 mg | ORAL_CAPSULE | Freq: Two times a day (BID) | ORAL | Status: DC
  Administered 2013-09-18 – 2013-09-22 (×7): 100 mg via ORAL
  Filled 2013-09-18 (×9): qty 1

## 2013-09-18 MED ORDER — METOPROLOL TARTRATE 1 MG/ML IV SOLN
2.5000 mg | Freq: Four times a day (QID) | INTRAVENOUS | Status: DC
  Administered 2013-09-18 – 2013-09-21 (×12): 2.5 mg via INTRAVENOUS
  Filled 2013-09-18 (×13): qty 5

## 2013-09-18 MED ORDER — ALBUTEROL SULFATE (2.5 MG/3ML) 0.083% IN NEBU: 2.5000 mg | INHALATION_SOLUTION | RESPIRATORY_TRACT | Status: DC | PRN

## 2013-09-18 MED ORDER — BENAZEPRIL HCL 10 MG PO TABS
10.0000 mg | ORAL_TABLET | Freq: Every morning | ORAL | Status: DC
  Administered 2013-09-18 – 2013-09-22 (×5): 10 mg via ORAL
  Filled 2013-09-18 (×5): qty 1

## 2013-09-18 MED ORDER — METHYLPREDNISOLONE SODIUM SUCC 125 MG IJ SOLR
60.0000 mg | Freq: Four times a day (QID) | INTRAMUSCULAR | Status: DC
  Administered 2013-09-18 – 2013-09-22 (×18): 60 mg via INTRAVENOUS
  Filled 2013-09-18 (×19): qty 2

## 2013-09-18 NOTE — Progress Notes (Signed)
ANTICOAGULATION CONSULT NOTE - Initial Consult  Pharmacy Consult for Heparin Indication: chest pain/ACS  Allergies  Allergen Reactions  . Codeine     REACTION-Unknown    Patient Measurements: Height: 6' (182.9 cm) Weight: 209 lb 3.5 oz (94.9 kg) IBW/kg (Calculated) : 77.6 Heparin Dosing Weight: 84.5Kg  Vital Signs: Temp: 98.4 F (36.9 C) (04/25 0212) Temp src: Axillary (04/25 0212) BP: 111/63 mmHg (04/25 0212) Pulse Rate: 88 (04/25 0212)  Labs:  Recent Labs  09/17/13 2139 09/18/13 0006  HGB 13.9  --   HCT 44.4  --   PLT 194  --   CREATININE 1.31  --   TROPONINI 0.69* 0.82*    Estimated Creatinine Clearance: 59.1 ml/min (by C-G formula based on Cr of 1.31).   Medical History: Past Medical History  Diagnosis Date  . COPD (chronic obstructive pulmonary disease)   . Diabetes mellitus   . Spinal disease   . Myocardial infarct     Medications:  Scheduled:  . aspirin  300 mg Rectal Daily  . benazepril  10 mg Oral q morning - 10a  . budesonide-formoterol  2 puff Inhalation BID  . diltiazem  180 mg Oral q morning - 10a  . docusate sodium  100 mg Oral BID  . heparin  4,000 Units Intravenous Once  . insulin aspart  0-15 Units Subcutaneous 6 times per day  . ipratropium-albuterol  0.5 mg Nebulization Q6H  . [START ON 09/19/2013] levofloxacin (LEVAQUIN) IV  500 mg Intravenous Q24H  . methylPREDNISolone (SOLU-MEDROL) injection  60 mg Intravenous Q6H  . metoprolol  2.5 mg Intravenous 4 times per day  . sertraline  100 mg Oral q morning - 10a  . simvastatin  40 mg Oral QHS  . sodium chloride  3 mL Intravenous Q12H    Assessment: 73yo male admitted with AMS and SOB.  Troponin noted to be elevated.  Asked to initiate Heparin for ACS.  Goal of Therapy:  Heparin level 0.3-0.7 units/ml Monitor platelets by anticoagulation protocol: Yes   Plan:  Heparin 4000 units IV bolus now x 1 Heparin infusion at 12 units/kg/hr (adj BW) Heparin level in 6-8 hours then  daily CBC daily while on heparin  Lilah Mijangos A Crytal Pensinger 09/18/2013,2:48 AM

## 2013-09-18 NOTE — H&P (Signed)
[facility] History and Physical  QUARTEZ LAGOS HWE:[id number] DOB: 1941-05-25 DOA: 09/17/2013   PCP: Lanette Hampshire, MD  Specialists: None  Chief Complaint: Altered mental status  HPI: [name] is a 73 y.o. male with a past medical history of ankylosing spondylitis, diabetes, COPD, coronary artery disease on medical management who was brought in due to alteration in mental status. He is accompanied by his wife, who provided most of the history. According to her patient has been getting more and more confused since Sunday. He has been short of breath, but does use oxygen at home around the clock. She was unable to tell me if he had been more short of breath than normal. No fever. No worsening cough. No nausea, vomiting, no headaches. No chest pain. He does have ankylosing spondylitis and as a result has chronic pain. Wife denies higher than normal intake of pain medications either. In the emergency department workup revealed a respiratory failure with hypercapnia and he was placed on BiPAP. History is limited due to his respiratory failure.  Home Medications: Prior to Admission medications   Medication Sig Start Date End Date Taking? Authorizing Provider  albuterol (PROVENTIL HFA;VENTOLIN HFA) 108 (90 BASE) MCG/ACT inhaler Inhale 2 puffs into the lungs every 6 (six) hours as needed. For rescue-shortness of breath   Yes Historical Provider, MD  ALPRAZolam (XANAX) 0.5 MG tablet Take 0.5 mg by mouth 3 (three) times daily as needed. anxiety    Yes Historical Provider, MD  benazepril (LOTENSIN) 10 MG tablet Take 10 mg by mouth every morning.   Yes Historical Provider, MD  budesonide-formoterol (SYMBICORT) 160-4.5 MCG/ACT inhaler Inhale 2 puffs into the lungs 2 (two) times daily as needed. 06/06/11 09/17/13 Yes Angus Ailene Ravel, MD  diltiazem (DILACOR XR) 180 MG 24 hr capsule Take 180 mg by mouth every morning.    Yes Historical Provider, MD  DIPHENHYDRAMINE HCL, TOPICAL, (BENADRYL ITCH  STOPPING) 2 % GEL Apply 1 application topically daily as needed.   Yes Historical Provider, MD  gabapentin (NEURONTIN) 100 MG capsule Take 100 mg by mouth 3 (three) times daily.  12/09/11  Yes Historical Provider, MD  metFORMIN (GLUCOPHAGE) 1000 MG tablet Take 1 tablet (1,000 mg total) by mouth 2 (two) times daily with a meal. 03/25/11 09/17/13 Yes Renella Cunas, MD  nystatin cream (MYCOSTATIN) Apply 1 application topically daily as needed for dry skin.   Yes Historical Provider, MD  oxyCODONE-acetaminophen (PERCOCET) 7.5-325 MG per tablet Take 1 tablet by mouth 3 (three) times daily as needed for pain.   Yes Historical Provider, MD  sertraline (ZOLOFT) 100 MG tablet Take 100 mg by mouth every morning.    Yes Historical Provider, MD  simvastatin (ZOCOR) 40 MG tablet Take 40 mg by mouth at bedtime.   Yes Historical Provider, MD  tiotropium (SPIRIVA) 18 MCG inhalation capsule Place 18 mcg into inhaler and inhale daily as needed (for shortness of breath).  06/06/11 09/17/13 Yes Angus Ailene Ravel, MD  traMADol (ULTRAM) 50 MG tablet Take 100 mg by mouth 2 (two) times daily as needed. pain    Yes Historical Provider, MD    Allergies:  Allergies  Allergen Reactions  . Codeine     REACTION-Unknown    Past Medical History: Past Medical History  Diagnosis Date  . COPD (chronic obstructive pulmonary disease)   . Diabetes mellitus   . Spinal disease   . Myocardial infarct     Past Surgical History  Procedure Laterality Date  .  Back surgery    . Foreign body removal  04/23/2012    Procedure: FOREIGN BODY REMOVAL;  Surgeon: Daneil Dolin, MD;  Location: [facility];  Service: Endoscopy;  Laterality: N/A;    Social History: Lives with his wife in [place]. Quit smoking 2 years ago. No alcohol use. He is bed bound and cannot even sit up at 90 angle.  Family History:  Family History  Problem Relation Age of Onset  . COPD Father      Review of Systems - unable to do due to respiratory  failure  Physical Examination  Filed Vitals:   09/18/13 0045 09/18/13 0051 09/18/13 0100 09/18/13 0106  BP: 102/52  111/63 111/63  Pulse: 88  82 87  Temp:      TempSrc:      Resp: $Remo'18  14 13  'TJBzy$ Height:      SpO2: 93% 91% 91% 90%    BP 111/63  Pulse 87  Temp(Src) 98.4 F (36.9 C) (Oral)  Resp 13  Ht 6' (1.829 m)  SpO2 90%  General appearance: cooperative, appears stated age, no distress and moderately obese Head: Normocephalic, without obvious abnormality, atraumatic Eyes: conjunctivae/corneas clear. PERRL, EOM's intact.  Neck: no adenopathy, no carotid bruit, no JVD, supple, symmetrical, trachea midline and thyroid not enlarged, symmetric, no tenderness/mass/nodules Resp: Decreased air entry at the bases without any wheezing. No Crackles Cardio: regular rate and rhythm, S1, S2 normal, no murmur, click, rub or gallop GI: soft, non-tender; bowel sounds normal; no masses,  no organomegaly Extremities: extremities normal, atraumatic, no cyanosis or edema Pulses: 2+ and symmetric Skin: Skin color, texture, turgor normal. No rashes or lesions Neurologic: He is awake, arousable. No focal deficits  Laboratory Data: Results for orders placed during the hospital encounter of 09/17/13 (from the past 48 hour(s))  CBC WITH DIFFERENTIAL     Status: Abnormal   Collection Time    09/17/13  9:39 PM      Result Value Ref Range   WBC 10.7 (*) 4.0 - 10.5 K/uL   RBC 4.46  4.22 - 5.81 MIL/uL   Hemoglobin 13.9  13.0 - 17.0 g/dL   HCT 44.4  39.0 - 52.0 %   MCV 99.6  78.0 - 100.0 fL   MCH 31.2  26.0 - 34.0 pg   MCHC 31.3  30.0 - 36.0 g/dL   RDW 14.4  11.5 - 15.5 %   Platelets 194  150 - 400 K/uL   Neutrophils Relative % 82 (*) 43 - 77 %   Neutro Abs 8.8 (*) 1.7 - 7.7 K/uL   Lymphocytes Relative 10 (*) 12 - 46 %   Lymphs Abs 1.1  0.7 - 4.0 K/uL   Monocytes Relative 6  3 - 12 %   Monocytes Absolute 0.6  0.1 - 1.0 K/uL   Eosinophils Relative 2  0 - 5 %   Eosinophils Absolute 0.2  0.0 - 0.7  K/uL   Basophils Relative 0  0 - 1 %   Basophils Absolute 0.0  0.0 - 0.1 K/uL  BASIC METABOLIC PANEL     Status: Abnormal   Collection Time    09/17/13  9:39 PM      Result Value Ref Range   Sodium 141  137 - 147 mEq/L   Potassium 5.3  3.7 - 5.3 mEq/L   Chloride 95 (*) 96 - 112 mEq/L   CO2 36 (*) 19 - 32 mEq/L   Glucose, Bld 165 (*) 70 - 99  mg/dL   BUN 16  6 - 23 mg/dL   Creatinine, Ser 1.31  0.50 - 1.35 mg/dL   Calcium 9.3  8.4 - 10.5 mg/dL   GFR calc non Af Amer 52 (*) >90 mL/min   GFR calc Af Amer 60 (*) >90 mL/min   Comment: (NOTE)     The eGFR has been calculated using the CKD EPI equation.     This calculation has not been validated in all clinical situations.     eGFR's persistently <90 mL/min signify possible Chronic Kidney     Disease.  TROPONIN I     Status: Abnormal   Collection Time    09/17/13  9:39 PM      Result Value Ref Range   Troponin I 0.69 (*) <0.30 ng/mL   Comment:            Due to the release kinetics of cTnI,     a negative result within the first hours     of the onset of symptoms does not rule out     myocardial infarction with certainty.     If myocardial infarction is still suspected,     repeat the test at appropriate intervals.     CRITICAL RESULT CALLED TO, READ BACK BY AND VERIFIED WITH:     BELTON,C ON 09/17/13 AT 2220 BY LOY,C  HEPATIC FUNCTION PANEL     Status: Abnormal   Collection Time    09/17/13  9:39 PM      Result Value Ref Range   Total Protein 7.3  6.0 - 8.3 g/dL   Albumin 3.9  3.5 - 5.2 g/dL   AST 15  0 - 37 U/L   ALT 11  0 - 53 U/L   Alkaline Phosphatase 65  39 - 117 U/L   Total Bilirubin 0.2 (*) 0.3 - 1.2 mg/dL   Bilirubin, Direct <0.2  0.0 - 0.3 mg/dL   Indirect Bilirubin NOT CALCULATED  0.3 - 0.9 mg/dL  I-STAT CG4 LACTIC ACID, ED     Status: None   Collection Time    09/17/13 10:48 PM      Result Value Ref Range   Lactic Acid, Venous 2.20  0.5 - 2.2 mmol/L  BLOOD GAS, ARTERIAL     Status: Abnormal   Collection Time     09/17/13 11:47 PM      Result Value Ref Range   FIO2 100.00     Delivery systems NON-REBREATHER OXYGEN MASK     pH, Arterial 7.214 (*) 7.350 - 7.450   Comment: CRITICAL RESULT CALLED TO, READ BACK BY AND VERIFIED WITH:      TO [name] J RN AT 2350 09/17/2013 [place] [facility]   pCO2 arterial 84.1 (*) 35.0 - 45.0 mmHg   Comment: CRITICAL RESULT CALLED TO, READ BACK BY AND VERIFIED WITH:      TO [name] J RN AT 2350 09/17/2013 [place] [facility]   pO2, Arterial 241.0 (*) 80.0 - 100.0 mmHg   Bicarbonate 32.7 (*) 20.0 - 24.0 mEq/L   TCO2 32.7  0 - 100 mmol/L   Acid-Base Excess 5.3 (*) 0.0 - 2.0 mmol/L   O2 Saturation 99.1     Patient temperature 37.0     Collection site RADIAL     Drawn by COLLECTED BY RT     Sample type ARTERIAL     Allens test (pass/fail) PASS  PASS  TROPONIN I     Status: Abnormal   Collection Time  09/18/13 12:06 AM      Result Value Ref Range   Troponin I 0.82 (*) <0.30 ng/mL   Comment:            Due to the release kinetics of cTnI,     a negative result within the first hours     of the onset of symptoms does not rule out     myocardial infarction with certainty.     If myocardial infarction is still suspected,     repeat the test at appropriate intervals.     CRITICAL VALUE NOTED.  VALUE IS CONSISTENT WITH PREVIOUSLY REPORTED AND CALLED VALUE.    Radiology Reports: Ct Head Wo Contrast  09/18/2013   CLINICAL DATA:  Altered mental status. Confusion and difficulty with speech and swallowing. Diffuse weakness.  EXAM: CT HEAD WITHOUT CONTRAST  TECHNIQUE: Contiguous axial images were obtained from the base of the skull through the vertex without intravenous contrast.  COMPARISON:  CT of the head performed 06/01/2011  FINDINGS: There is no evidence of acute infarction, mass lesion, or intra- or extra-axial hemorrhage on CT.  Periventricular and subcortical white matter change likely reflects small vessel ischemic microangiopathy. There may be a small chronic infarct at the  medial left frontal lobe, unchanged from 2013.  The posterior fossa, including the cerebellum, brainstem and fourth ventricle, is within normal limits. The third and lateral ventricles, and basal ganglia are unremarkable in appearance. The No mass effect or midline shift is seen.  There is no evidence of fracture; visualized osseous structures are unremarkable in appearance. The orbits are within normal limits. The paranasal sinuses and mastoid air cells are well-aerated. No significant soft tissue abnormalities are seen.  IMPRESSION: 1. No evidence of acute intracranial pathology on CT. 2. 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   Electronically Signed   By: Garald Balding M.D.   On: 09/18/2013 00:19   Dg Chest Portable 1 View  09/17/2013   CLINICAL DATA:  Weakness, shortness of breath, altered mental status, abnormal labs, history diabetes, COPD, MI  EXAM: PORTABLE CHEST - 1 VIEW  COMPARISON:  Portable exam 2145 hr compared to 04/23/2012  FINDINGS: Upper normal heart size.  Slight pulmonary vascular congestion.  Atherosclerotic calcification aorta.  Bronchitic changes without gross infiltrate, pleural effusion or pneumothorax.  Superior mediastinum a slight prominence a unchanged, potentially related to slightly lordotic AP technique.  No pneumothorax or acute osseous findings.  IMPRESSION: Bronchitic changes.   Electronically Signed   By: Lavonia Dana M.D.   On: 09/17/2013 21:55    Electrocardiogram: Sinus rhythm at 89 beats per minute. Normal axis. Intervals appear to be normal. Some degree of intraventricular conduction delay is noted. Nonspecific ST changes in the inferior leads. No clear evidence for depression or elevation. T wave flattening is noted. Compared to previous EKG T inversion is no longer present.  Problem List  Principal Problem:   Respiratory failure, acute Active Problems:   DIABETES MELLITUS, TYPE II    COPD   SLEEP APNEA   Hypercapnemia   NSTEMI (non-ST elevated myocardial infarction)   Assessment: This is a 73 year old, Caucasian male, who presents with altered mental status, and is found to have hypercapnic respiratory failure. Wife denies him more than usual intake of pain medications. It's possible he may be experiencing COPD exacerbation, although I did not appreciate any wheezing on examination. He also has elevated troponin level, which is most likely non-ST  elevation MI due to demand ischemia rather than a de novo cardiac event. EKG is nonspecific.  Plan: #1 acute respiratory failure with hypercapnia: Continue BiPAP for now. Repeat ABG in the morning. His mental status is already improved according to his wife. Admit to step down care.  #2 COPD with possible acute exacerbation: He was given steroids, antibiotics, and nebulizer treatments. Continue BiPAP as discussed above. Repeat ABG in the morning.  #3 non-ST elevation MI/demand ischemia: He denies chest pain. Trend troponin. Initiate intravenous heparin. Cardiology notes from 2013 and 2014 reviewed. He was considered a candidate only for medical management at that time likely due to poor baseline functioning. We'll continue aspirin. Will give doses of beta blockers intravenously for now. Monitor him on telemetry. Echocardiogram will be ordered. Consider cardiology consultation Monday.  #4 diabetes mellitus, type II: Initiate sliding scale coverage. Hold his metformin for now.  #5 ankylosing spondylitis with chronic pain issues: Morphine as needed. He is bedbound at baseline.  #6 altered mental status: Most likely due to hypercapnia. Mental status is already improved on BiPAP. Continue to monitor closely   DVT Prophylaxis: IV heparin for demand ischemia Code Status: DO NOT RESUSCITATE per discussions of ED physicians with his wife Family Communication: Discussed with wife  Disposition Plan: Admit to step down. Dr. Everette Rank to  assume care in the morning   Further management decisions will depend on results of further testing and patient's response to treatment.   Bonnielee Haff  [facility] Pager [phone]  If 7PM-7AM, please contact night-coverage [email or web address] Password [email or web address]  09/18/2013, 1:32 AM

## 2013-09-18 NOTE — Progress Notes (Signed)
Subjective: The patient is feeling better today . He was admitted with acute respiratory failure and/or BiPAP through the night and began to feel better. He was started on IV antibiotics at and steroids as well. He did have altered mental status on admission but is more alert this a.m. communicating with nurse. His blood gases have improved  Objective: Vital signs in last 24 hours: Temp:  [98.3 F (36.8 C)-98.4 F (36.9 C)] 98.3 F (36.8 C) (04/25 0730) Pulse Rate:  [72-91] 80 (04/25 0600) Resp:  [12-28] 15 (04/25 0600) BP: (89-118)/(49-77) 105/59 mmHg (04/25 0600) SpO2:  [89 %-99 %] 93 % (04/25 0600) FiO2 (%):  [40 %] 40 % (04/25 0501) Weight:  [94.9 kg (209 lb 3.5 oz)] 94.9 kg (209 lb 3.5 oz) (04/25 0212) Weight change:     Intake/Output from previous day: 04/24 0701 - 04/25 0700 In: 77.5 [I.V.:77.5] Out: -  Intake/Output this shift:    Physical Exam: The patient is alert and oriented  HEENT negative  Neck supple no JVD or thyroid abnormalities  Heart regular rhythm no murmurs  Lungs diminished breath sounds no rhonchi  Abdomen no palpable organs or masses  Neurological patient has motor weakness of extremities   Recent Labs  09/17/13 2139 09/18/13 0526  WBC 10.7* 8.8  HGB 13.9 12.9*  HCT 44.4 41.7  PLT 194 175   BMET  Recent Labs  09/17/13 2139  NA 141  K 5.3  CL 95*  CO2 36*  GLUCOSE 165*  BUN 16  CREATININE 1.31  CALCIUM 9.3    Studies/Results: Ct Head Wo Contrast  09/18/2013   CLINICAL DATA:  Altered mental status. Confusion and difficulty with speech and swallowing. Diffuse weakness.  EXAM: CT HEAD WITHOUT CONTRAST  TECHNIQUE: Contiguous axial images were obtained from the base of the skull through the vertex without intravenous contrast.  COMPARISON:  CT of the head performed 06/01/2011  FINDINGS: There is no evidence of acute infarction, mass lesion, or intra- or extra-axial hemorrhage on CT.  Periventricular and subcortical white matter  change likely reflects small vessel ischemic microangiopathy. 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  The posterior fossa, including the cerebellum, brainstem and fourth ventricle, is within normal limits. The third and lateral ventricles, and basal ganglia are unremarkable in appearance. The No mass effect or midline shift is seen.  There is no evidence of fracture; visualized osseous structures are unremarkable in appearance. The orbits are within normal limits. The paranasal sinuses and mastoid air cells are well-aerated. No significant soft tissue abnormalities are seen.  IMPRESSION: 1. No evidence of acute intracranial pathology on CT. 2. 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   Electronically Signed   By: Roanna RaiderJeffery  Chang M.D.   On: 09/18/2013 00:19   Dg Chest Portable 1 View  09/17/2013   CLINICAL DATA:  Weakness, shortness of breath, altered mental status, abnormal labs, history diabetes, COPD, MI  EXAM: PORTABLE CHEST - 1 VIEW  COMPARISON:  Portable exam 2145 hr compared to 04/23/2012  FINDINGS: Upper normal heart size.  Slight pulmonary vascular congestion.  Atherosclerotic calcification aorta.  Bronchitic changes without gross infiltrate, pleural effusion or pneumothorax.  Superior mediastinum a slight prominence a unchanged, potentially related to slightly lordotic AP technique.  No pneumothorax or acute osseous findings.  IMPRESSION: Bronchitic changes.   Electronically Signed   By: Ulyses SouthwardMark  Boles M.D.   On:  09/17/2013 21:55    Medications:  . aspirin  300 mg Rectal Daily  . benazepril  10 mg Oral q morning - 10a  . budesonide-formoterol  2 puff Inhalation BID  . diltiazem  180 mg Oral q morning - 10a  . docusate sodium  100 mg Oral BID  . insulin aspart  0-15 Units Subcutaneous 6 times per day  . ipratropium-albuterol  3 mL Nebulization Q6H  . [START ON 09/19/2013]  levofloxacin (LEVAQUIN) IV  500 mg Intravenous Q24H  . methylPREDNISolone (SOLU-MEDROL) injection  60 mg Intravenous Q6H  . metoprolol  2.5 mg Intravenous 4 times per day  . sertraline  100 mg Oral q morning - 10a  . simvastatin  40 mg Oral QHS  . sodium chloride  3 mL Intravenous Q12H    . sodium chloride 50 mL/hr at 09/18/13 0227  . heparin 1,000 Units/hr (09/18/13 0400)     Assessment/Plan: 1 respiratory failure acute-COPD plan continue current IV antibiotics Levaquin, Solu-Medrol every 6 hours, neb treatments every 6 hours continue BiPAP at current settings-continue to monitor blood gases  Diabetes mellitus continue insulin short-acting 6 times daily   LOS: 1 day   [name] 09/18/2013, 8:02 AM

## 2013-09-18 NOTE — Progress Notes (Signed)
CRITICAL VALUE ALERT  Critical value received: ABG results: pH 7.27, CO2 68.8, O2 71.5. HCO3 30.9   Date of notification:  09/18/13    Time of notification:  0520  Critical value read back: yes     Nurse who received alert:  Eligha BridegroomM. McDaniel   MD notified (1st page):  N/a values improved  Time of first page:  n/a  MD notified (2nd page):  Time of second page:  Responding MD:   Time MD responded:

## 2013-09-18 NOTE — Progress Notes (Signed)
Echocardiogram 2D Echocardiogram has been performed.  [name] GrumblesMelissa J Hasina Kreager 09/18/2013, 9:43 AM

## 2013-09-18 NOTE — ED Notes (Signed)
Attempted catheter x 2 with no success.

## 2013-09-18 NOTE — Progress Notes (Signed)
ANTICOAGULATION CONSULT NOTE - follow up  Pharmacy Consult for Heparin Indication: chest pain/ACS  Allergies  Allergen Reactions  . Codeine     REACTION-Unknown   Patient Measurements: Height: 6' (182.9 cm) Weight: 209 lb 3.5 oz (94.9 kg) IBW/kg (Calculated) : 77.6 Heparin Dosing Weight: 84.5Kg  Vital Signs: Temp: 98.7 F (37.1 C) (04/25 2000) Temp src: Oral (04/25 2000) BP: 99/60 mmHg (04/25 1600) Pulse Rate: 84 (04/25 1600)  Labs:  Recent Labs  09/17/13 2139 09/18/13 0006 09/18/13 0526 09/18/13 0735 09/18/13 1124 09/18/13 1630  HGB 13.9  --  12.9*  --   --   --   HCT 44.4  --  41.7  --   --   --   PLT 194  --  175  --   --   --   HEPARINUNFRC  --   --   --  0.13*  --  0.26*  CREATININE 1.31  --  0.98  --   --   --   TROPONINI 0.69* 0.82* 0.96*  --  0.66*  --    Estimated Creatinine Clearance: 79 ml/min (by C-G formula based on Cr of 0.98).  Medical History: Past Medical History  Diagnosis Date  . COPD (chronic obstructive pulmonary disease)   . Diabetes mellitus   . Spinal disease   . Myocardial infarct    Medications:  Scheduled:  . aspirin  300 mg Rectal Daily  . benazepril  10 mg Oral q morning - 10a  . budesonide-formoterol  2 puff Inhalation BID  . diltiazem  180 mg Oral q morning - 10a  . docusate sodium  100 mg Oral BID  . insulin aspart  0-15 Units Subcutaneous 6 times per day  . ipratropium-albuterol  3 mL Nebulization Q6H  . [START ON 09/19/2013] levofloxacin (LEVAQUIN) IV  500 mg Intravenous Q24H  . methylPREDNISolone (SOLU-MEDROL) injection  60 mg Intravenous Q6H  . metoprolol  2.5 mg Intravenous 4 times per day  . sertraline  100 mg Oral q morning - 10a  . simvastatin  40 mg Oral QHS  . sodium chloride  3 mL Intravenous Q12H   Assessment: 73yo male admitted with AMS and SOB.  Troponin noted to be elevated.  Asked to initiate Heparin for ACS.  Heparin level below target.    Goal of Therapy:  Heparin level 0.3-0.7 units/ml Monitor  platelets by anticoagulation protocol: Yes   Plan:  Increase Heparin infusion to 1500 units/hr Heparin level daily CBC daily while on heparin  Aeron Lheureux A Rollande Thursby 09/18/2013,8:33 PM

## 2013-09-18 NOTE — Progress Notes (Signed)
Dr Trenton FoundsMccinnis updated on troponin of .06

## 2013-09-18 NOTE — Progress Notes (Signed)
ANTICOAGULATION CONSULT NOTE - follow up  Pharmacy Consult for Heparin Indication: chest pain/ACS  Allergies  Allergen Reactions  . Codeine     REACTION-Unknown   Patient Measurements: Height: 6' (182.9 cm) Weight: 209 lb 3.5 oz (94.9 kg) IBW/kg (Calculated) : 77.6 Heparin Dosing Weight: 84.5Kg  Vital Signs: Temp: 98.3 F (36.8 C) (04/25 0730) Temp src: Axillary (04/25 0730) BP: 109/76 mmHg (04/25 0952) Pulse Rate: 80 (04/25 0810)  Labs:  Recent Labs  09/17/13 2139 09/18/13 0006 09/18/13 0526 09/18/13 0735  HGB 13.9  --  12.9*  --   HCT 44.4  --  41.7  --   PLT 194  --  175  --   HEPARINUNFRC  --   --   --  0.13*  CREATININE 1.31  --   --   --   TROPONINI 0.69* 0.82*  --   --    Estimated Creatinine Clearance: 59.1 ml/min (by C-G formula based on Cr of 1.31).  Medical History: Past Medical History  Diagnosis Date  . COPD (chronic obstructive pulmonary disease)   . Diabetes mellitus   . Spinal disease   . Myocardial infarct    Medications:  Scheduled:  . aspirin  300 mg Rectal Daily  . benazepril  10 mg Oral q morning - 10a  . budesonide-formoterol  2 puff Inhalation BID  . diltiazem  180 mg Oral q morning - 10a  . docusate sodium  100 mg Oral BID  . insulin aspart  0-15 Units Subcutaneous 6 times per day  . ipratropium-albuterol  3 mL Nebulization Q6H  . [START ON 09/19/2013] levofloxacin (LEVAQUIN) IV  500 mg Intravenous Q24H  . methylPREDNISolone (SOLU-MEDROL) injection  60 mg Intravenous Q6H  . metoprolol  2.5 mg Intravenous 4 times per day  . sertraline  100 mg Oral q morning - 10a  . simvastatin  40 mg Oral QHS  . sodium chloride  3 mL Intravenous Q12H   Assessment: 73yo male admitted with AMS and SOB.  Troponin noted to be elevated.  Asked to initiate Heparin for ACS.  Goal of Therapy:  Heparin level 0.3-0.7 units/ml Monitor platelets by anticoagulation protocol: Yes   Plan:  Increase Heparin infusion to 1300 units/hr Heparin level in 6-8  hours then daily CBC daily while on heparin  [name] A [name] 09/18/2013,10:13 AM

## 2013-09-18 NOTE — ED Notes (Signed)
Patient's oxygen saturation dropped to 80% on 6 liters via nasal canula. Dr. Lynelle DoctorKnapp notified and patient placed on 15 L non-rebreather.

## 2013-09-19 LAB — GLUCOSE, CAPILLARY
GLUCOSE-CAPILLARY: 312 mg/dL — AB (ref 70–99)
Glucose-Capillary: 259 mg/dL — ABNORMAL HIGH (ref 70–99)
Glucose-Capillary: 223 mg/dL — ABNORMAL HIGH (ref 70–99)
Glucose-Capillary: 297 mg/dL — ABNORMAL HIGH (ref 70–99)
Glucose-Capillary: 338 mg/dL — ABNORMAL HIGH (ref 70–99)

## 2013-09-19 LAB — HEPARIN LEVEL (UNFRACTIONATED)
HEPARIN UNFRACTIONATED: 0.37 [IU]/mL (ref 0.30–0.70)
Heparin Unfractionated: 0.29 IU/mL — ABNORMAL LOW (ref 0.30–0.70)

## 2013-09-19 LAB — BASIC METABOLIC PANEL
BUN: 16 mg/dL (ref 6–23)
CHLORIDE: 96 meq/L (ref 96–112)
CO2: 32 mEq/L (ref 19–32)
CREATININE: 0.63 mg/dL (ref 0.50–1.35)
Calcium: 9.6 mg/dL (ref 8.4–10.5)
GFR calc Af Amer: >90 mL/min (ref >90)
GFR calc non Af Amer: >90 mL/min (ref >90)
GLUCOSE: 269 mg/dL — AB (ref 70–99)
Potassium: 4.1 mEq/L (ref 3.7–5.3)
Sodium: 141 mEq/L (ref 137–147)

## 2013-09-19 LAB — CBC
HEMATOCRIT: 40.2 % (ref 39.0–52.0)
HEMOGLOBIN: 12.8 g/dL — AB (ref 13.0–17.0)
MCH: 30.8 pg (ref 26.0–34.0)
MCHC: 31.8 g/dL (ref 30.0–36.0)
MCV: 96.6 fL (ref 78.0–100.0)
Platelets: 212 10*3/uL (ref 150–400)
RBC: 4.16 MIL/uL — ABNORMAL LOW (ref 4.22–5.81)
RDW: 14.4 % (ref 11.5–15.5)
WBC: 8.7 10*3/uL (ref 4.0–10.5)

## 2013-09-19 MED ORDER — LORAZEPAM 2 MG/ML IJ SOLN
1.0000 mg | Freq: Once | INTRAMUSCULAR | Status: AC
  Administered 2013-09-19: 1 mg via INTRAVENOUS
  Filled 2013-09-19 (×2): qty 1

## 2013-09-19 MED ORDER — ALPRAZOLAM 0.5 MG PO TABS
0.5000 mg | ORAL_TABLET | Freq: Three times a day (TID) | ORAL | Status: DC | PRN
  Administered 2013-09-19 – 2013-09-22 (×7): 0.5 mg via ORAL
  Filled 2013-09-19 (×7): qty 1

## 2013-09-19 MED ORDER — ASPIRIN 325 MG PO TABS
325.0000 mg | ORAL_TABLET | Freq: Every day | ORAL | Status: DC
  Administered 2013-09-19 – 2013-09-22 (×4): 325 mg via ORAL
  Filled 2013-09-19 (×4): qty 1

## 2013-09-19 MED ORDER — OXYCODONE-ACETAMINOPHEN 5-325 MG PO TABS
1.0000 | ORAL_TABLET | Freq: Three times a day (TID) | ORAL | Status: DC | PRN
  Administered 2013-09-19 (×2): 1 via ORAL
  Administered 2013-09-19: 2 via ORAL
  Administered 2013-09-20 (×2): 1 via ORAL
  Administered 2013-09-21 – 2013-09-22 (×3): 2 via ORAL
  Filled 2013-09-19 (×3): qty 2
  Filled 2013-09-19 (×2): qty 1
  Filled 2013-09-19: qty 2
  Filled 2013-09-19: qty 1
  Filled 2013-09-19: qty 2

## 2013-09-19 NOTE — Progress Notes (Signed)
ANTICOAGULATION CONSULT NOTE - follow up  Pharmacy Consult for Heparin Indication: chest pain/ACS  Allergies  Allergen Reactions  . Codeine     REACTION-Unknown   Patient Measurements: Height: 6' (182.9 cm) Weight: 212 lb 4.9 oz (96.3 kg) IBW/kg (Calculated) : 77.6 Heparin Dosing Weight: 84.5Kg  Vital Signs: Temp: 98.2 F (36.8 C) (04/26 1630) Temp src: Oral (04/26 1630) BP: 108/48 mmHg (04/26 1400)  Labs:  Recent Labs  09/17/13 2139  09/18/13 0526  09/18/13 1124 09/18/13 1630 09/19/13 0504 09/19/13 1218  HGB 13.9  --  12.9*  --   --   --  12.8*  --   HCT 44.4  --  41.7  --   --   --  40.2  --   PLT 194  --  175  --   --   --  212  --   HEPARINUNFRC  --   --   --   < >  --  0.26* 0.29* 0.37  CREATININE 1.31  --  0.98  --   --   --  0.63  --   TROPONINI 0.69*  < > 0.96*  --  0.66* 0.55*  --   --   < > = values in this interval not displayed. Estimated Creatinine Clearance: 97.5 ml/min (by C-G formula based on Cr of 0.63).  Medical History: Past Medical History  Diagnosis Date  . COPD (chronic obstructive pulmonary disease)   . Diabetes mellitus   . Spinal disease   . Myocardial infarct    Medications:  Scheduled:  . aspirin  325 mg Oral Daily  . benazepril  10 mg Oral q morning - 10a  . budesonide-formoterol  2 puff Inhalation BID  . diltiazem  180 mg Oral q morning - 10a  . docusate sodium  100 mg Oral BID  . insulin aspart  0-15 Units Subcutaneous 6 times per day  . ipratropium-albuterol  3 mL Nebulization Q6H  . levofloxacin (LEVAQUIN) IV  500 mg Intravenous Q24H  . methylPREDNISolone (SOLU-MEDROL) injection  60 mg Intravenous Q6H  . metoprolol  2.5 mg Intravenous 4 times per day  . sertraline  100 mg Oral q morning - 10a  . simvastatin  40 mg Oral QHS  . sodium chloride  3 mL Intravenous Q12H   Assessment: 73yo male admitted with AMS and SOB.  Troponin noted to be elevated initially.  Asked to initiate Heparin for ACS.  Heparin level is now on  target.    Goal of Therapy:  Heparin level 0.3-0.7 units/ml Monitor platelets by anticoagulation protocol: Yes   Plan:  Continue Heparin infusion at 1700 units/hr Heparin level daily CBC daily while on heparin  Aleni Andrus A Doral Digangi 09/19/2013,8:05 PM

## 2013-09-19 NOTE — Progress Notes (Signed)
Subjective: The patient remained fairly alert yesterday but did experience some mental confusion and had been put back on BiPAP. He feels fairly comfortable this morning. His O2 sats ranged from 94-97%  Objective: Vital signs in last 24 hours: Temp:  [97.5 F (36.4 C)-99.7 F (37.6 C)] 99.2 F (37.3 C) (04/26 0400) Pulse Rate:  [73-95] 80 (04/25 2356) Resp:  [13-21] 21 (04/25 2356) BP: (95-130)/(58-76) 127/64 mmHg (04/25 2356) SpO2:  [92 %-98 %] 98 % (04/26 0209) FiO2 (%):  [40 %] 40 % (04/26 0209) Weight:  [96.3 kg (212 lb 4.9 oz)] 96.3 kg (212 lb 4.9 oz) (04/26 0500) Weight change: 1.4 kg (3 lb 1.4 oz) Last BM Date: 09/18/13  Intake/Output from previous day: 04/25 0701 - 04/26 0700 In: 631 [I.V.:631] Out: 1525 [Urine:1525] Intake/Output this shift:    Physical Exam: The patient is alert and oriented  H. EENT negative  Neck supple no JVD or thyroid abnormalities  Heart regular rhythm no murmurs  Lungs diminished breath sounds no rhonchi  Abdomen the palpable organs or masses  Neurological patient has motor weakness of extremities   Recent Labs  09/18/13 0526 09/19/13 0504  WBC 8.8 8.7  HGB 12.9* 12.8*  HCT 41.7 40.2  PLT 175 212   BMET  Recent Labs  09/18/13 0526 09/19/13 0504  NA 137 141  K 5.6* 4.1  CL 93* 96  CO2 30 32  GLUCOSE 246* 269*  BUN 19 16  CREATININE 0.98 0.63  CALCIUM 8.8 9.6    Studies/Results: Ct Head Wo Contrast  09/18/2013   CLINICAL DATA:  Altered mental status. Confusion and difficulty with speech and swallowing. Diffuse weakness.  EXAM: CT HEAD WITHOUT CONTRAST  TECHNIQUE: Contiguous axial images were obtained from the base of the skull through the vertex without intravenous contrast.  COMPARISON:  CT of the head performed 06/01/2011  FINDINGS: There is no evidence of acute infarction, mass lesion, or intra- or extra-axial hemorrhage on CT.  Periventricular and subcortical white matter change likely reflects small vessel  ischemic microangiopathy. 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  The posterior fossa, including the cerebellum, brainstem and fourth ventricle, is within normal limits. The third and lateral ventricles, and basal ganglia are unremarkable in appearance. The No mass effect or midline shift is seen.  There is no evidence of fracture; visualized osseous structures are unremarkable in appearance. The orbits are within normal limits. The paranasal sinuses and mastoid air cells are well-aerated. No significant soft tissue abnormalities are seen.  IMPRESSION: 1. No evidence of acute intracranial pathology on CT. 2. 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   Electronically Signed   By: Roanna RaiderJeffery  Chang M.D.   On: 09/18/2013 00:19   Dg Chest Portable 1 View  09/17/2013   CLINICAL DATA:  Weakness, shortness of breath, altered mental status, abnormal labs, history diabetes, COPD, MI  EXAM: PORTABLE CHEST - 1 VIEW  COMPARISON:  Portable exam 2145 hr compared to 04/23/2012  FINDINGS: Upper normal heart size.  Slight pulmonary vascular congestion.  Atherosclerotic calcification aorta.  Bronchitic changes without gross infiltrate, pleural effusion or pneumothorax.  Superior mediastinum a slight prominence a unchanged, potentially related to slightly lordotic AP technique.  No pneumothorax or acute osseous findings.  IMPRESSION: Bronchitic changes.   Electronically Signed   By: Ulyses SouthwardMark  Boles M.D.   On: 09/17/2013 21:55    Medications:  .  aspirin  300 mg Rectal Daily  . benazepril  10 mg Oral q morning - 10a  . budesonide-formoterol  2 puff Inhalation BID  . diltiazem  180 mg Oral q morning - 10a  . docusate sodium  100 mg Oral BID  . insulin aspart  0-15 Units Subcutaneous 6 times per day  . ipratropium-albuterol  3 mL Nebulization Q6H  . levofloxacin (LEVAQUIN) IV  500 mg Intravenous Q24H  .  methylPREDNISolone (SOLU-MEDROL) injection  60 mg Intravenous Q6H  . metoprolol  2.5 mg Intravenous 4 times per day  . sertraline  100 mg Oral q morning - 10a  . simvastatin  40 mg Oral QHS  . sodium chloride  3 mL Intravenous Q12H    . heparin 1,500 Units/hr (09/18/13 2056)     Assessment/Plan: 1. Acute respiratory failure COPD-plan to continue current IV antibiotics Levaquin and Solu-Medrol and neb treatments continue BiPAP will attempt to get patient on nasal cannula today  2. Diabetes mellitus continue short-acting insulin  3. Coronary artery disease with peak of troponin-discussions were held with the cardiologist on call yesterday. It was his feeling that he should continue heparin drip. Continue to closely monitor for changes. Conservative management recommended for now.  LOS: 2 days   Addelynn Batte G Camdin Hegner 09/19/2013, 7:05 AM

## 2013-09-19 NOTE — Progress Notes (Signed)
ANTICOAGULATION CONSULT NOTE - follow up  Pharmacy Consult for Heparin Indication: chest pain/ACS  Allergies  Allergen Reactions  . Codeine     REACTION-Unknown   Patient Measurements: Height: 6' (182.9 cm) Weight: 212 lb 4.9 oz (96.3 kg) IBW/kg (Calculated) : 77.6 Heparin Dosing Weight: 84.5Kg  Vital Signs: Temp: 99.2 F (37.3 C) (04/26 0400) Temp src: Oral (04/26 0400) BP: 127/64 mmHg (04/25 2356) Pulse Rate: 80 (04/25 2356)  Labs:  Recent Labs  09/17/13 2139  09/18/13 0526 09/18/13 0735 09/18/13 1124 09/18/13 1630 09/19/13 0504  HGB 13.9  --  12.9*  --   --   --  12.8*  HCT 44.4  --  41.7  --   --   --  40.2  PLT 194  --  175  --   --   --  212  HEPARINUNFRC  --   --   --  0.13*  --  0.26* 0.29*  CREATININE 1.31  --  0.98  --   --   --  0.63  TROPONINI 0.69*  < > 0.96*  --  0.66* 0.55*  --   < > = values in this interval not displayed. Estimated Creatinine Clearance: 97.5 ml/min (by C-G formula based on Cr of 0.63).  Medical History: Past Medical History  Diagnosis Date  . COPD (chronic obstructive pulmonary disease)   . Diabetes mellitus   . Spinal disease   . Myocardial infarct    Medications:  Scheduled:  . aspirin  300 mg Rectal Daily  . benazepril  10 mg Oral q morning - 10a  . budesonide-formoterol  2 puff Inhalation BID  . diltiazem  180 mg Oral q morning - 10a  . docusate sodium  100 mg Oral BID  . insulin aspart  0-15 Units Subcutaneous 6 times per day  . ipratropium-albuterol  3 mL Nebulization Q6H  . levofloxacin (LEVAQUIN) IV  500 mg Intravenous Q24H  . methylPREDNISolone (SOLU-MEDROL) injection  60 mg Intravenous Q6H  . metoprolol  2.5 mg Intravenous 4 times per day  . sertraline  100 mg Oral q morning - 10a  . simvastatin  40 mg Oral QHS  . sodium chloride  3 mL Intravenous Q12H   Assessment: 73yo male admitted with AMS and SOB.  Troponin noted to be elevated initially.  Asked to initiate Heparin for ACS.  Heparin level below target.     Goal of Therapy:  Heparin level 0.3-0.7 units/ml Monitor platelets by anticoagulation protocol: Yes   Plan:  Increase Heparin infusion to 1700 units/hr Heparin level daily CBC daily while on heparin  Gabi Mcfate A Onica Davidovich 09/19/2013,7:48 AM

## 2013-09-19 NOTE — Progress Notes (Signed)
Patient confused and agitated. May try him without the BIPAP tonight and see how he does. Last night he would not keep the mask or his leads on. Respiratory acidosis has resolved. Bipap still in room if needed.

## 2013-09-19 NOTE — Progress Notes (Signed)
Pt resting quietly in bed. Pt alert and oriented to person and place. {t reoriented to place and time. Right #22 PIV intact. Heparin drip infusing at 17cc (1700 units) per hour. Pt NSR on telemetry.

## 2013-09-19 NOTE — Progress Notes (Signed)
Pt has been pulling at BiPAP and leads he is some what confused is beginning to argue. Taking him off BiPAP as he will not keep it on. I don't think this is PCO2 related. Will leave off BiPAP and monitor.

## 2013-09-20 LAB — GLUCOSE, CAPILLARY
GLUCOSE-CAPILLARY: 289 mg/dL — AB (ref 70–99)
GLUCOSE-CAPILLARY: 277 mg/dL — AB (ref 70–99)
GLUCOSE-CAPILLARY: 284 mg/dL — AB (ref 70–99)
GLUCOSE-CAPILLARY: 296 mg/dL — AB (ref 70–99)
Glucose-Capillary: 250 mg/dL — ABNORMAL HIGH (ref 70–99)

## 2013-09-20 LAB — CBC
HEMATOCRIT: 39 % (ref 39.0–52.0)
HEMOGLOBIN: 12.1 g/dL — AB (ref 13.0–17.0)
MCH: 30.4 pg (ref 26.0–34.0)
MCHC: 31 g/dL (ref 30.0–36.0)
MCV: 98 fL (ref 78.0–100.0)
Platelets: 221 10*3/uL (ref 150–400)
RBC: 3.98 MIL/uL — ABNORMAL LOW (ref 4.22–5.81)
RDW: 14.8 % (ref 11.5–15.5)
WBC: 8.6 10*3/uL (ref 4.0–10.5)

## 2013-09-20 LAB — HEPARIN LEVEL (UNFRACTIONATED)
HEPARIN UNFRACTIONATED: 0.28 [IU]/mL — AB (ref 0.30–0.70)
Heparin Unfractionated: 0.45 IU/mL (ref 0.30–0.70)

## 2013-09-20 MED ORDER — INSULIN GLARGINE 100 UNIT/ML ~~LOC~~ SOLN
20.0000 [IU] | Freq: Every day | SUBCUTANEOUS | Status: DC
  Administered 2013-09-20: 20 [IU] via SUBCUTANEOUS
  Filled 2013-09-20 (×2): qty 0.2

## 2013-09-20 MED ORDER — LEVOFLOXACIN IN D5W 500 MG/100ML IV SOLN
INTRAVENOUS | Status: AC
  Filled 2013-09-20: qty 100

## 2013-09-20 NOTE — Progress Notes (Signed)
Subjective: The patient a fairly comfortable night. He did not use BiPAP but is on she didn't by nasal catheter and O2 sats or in more normal range. He has not experienced any chest pain  Objective: Vital signs in last 24 hours: Temp:  [97.8 F (36.6 C)-98.8 F (37.1 C)] 98 F (36.7 C) (04/27 0400) Resp:  [16-24] 16 (04/27 0400) BP: (108-148)/(48-65) 111/53 mmHg (04/27 0400) SpO2:  [95 %-96 %] 96 % (04/26 1949) Weight:  [92.5 kg (203 lb 14.8 oz)] 92.5 kg (203 lb 14.8 oz) (04/27 0500) Weight change: -3.8 kg (-8 lb 6 oz) Last BM Date: 09/18/13  Intake/Output from previous day: 04/26 0701 - 04/27 0700 In: 480 [P.O.:480] Out: 250 [Urine:250] Intake/Output this shift:    Physical Exam: The patient remains fairly alert  HEENT negative  Neck supple no JVD or thyroid abnormalities  Heart regular rhythm no murmurs  Lungs diminished breath sounds with rhonchi  Abdomen no palpable organs or masses  Neurological patient has motor weakness in extremities   Recent Labs  09/19/13 0504 09/20/13 0430  WBC 8.7 8.6  HGB 12.8* 12.1*  HCT 40.2 39.0  PLT 212 221   BMET  Recent Labs  09/18/13 0526 09/19/13 0504  NA 137 141  K 5.6* 4.1  CL 93* 96  CO2 30 32  GLUCOSE 246* 269*  BUN 19 16  CREATININE 0.98 0.63  CALCIUM 8.8 9.6    Studies/Results: No results found.  Medications:  . aspirin  325 mg Oral Daily  . benazepril  10 mg Oral q morning - 10a  . budesonide-formoterol  2 puff Inhalation BID  . diltiazem  180 mg Oral q morning - 10a  . docusate sodium  100 mg Oral BID  . insulin aspart  0-15 Units Subcutaneous 6 times per day  . ipratropium-albuterol  3 mL Nebulization Q6H  . levofloxacin (LEVAQUIN) IV  500 mg Intravenous Q24H  . methylPREDNISolone (SOLU-MEDROL) injection  60 mg Intravenous Q6H  . metoprolol  2.5 mg Intravenous 4 times per day  . sertraline  100 mg Oral q morning - 10a  . simvastatin  40 mg Oral QHS  . sodium chloride  3 mL Intravenous Q12H     . heparin 1,700 Units/hr (09/19/13 1235)     Assessment/Plan: 1. Acute respiratory failure COPD-plan to continue current IV antibiotics Levaquin, Solu-Medrol and nebulizer treatments-continue nasal O2 at current flow rate.  2. Diabetes mellitus-continue short-acting insulin diabetic diet continue monitor blood sugars  3. Coronary artery disease with peak of troponin possible non-ST MI plan to continue heparin drip will obtain cardiology consult today.    LOS: 3 days   Addalyn Speedy G Adeleigh Barletta 09/20/2013, 6:24 AM

## 2013-09-20 NOTE — Progress Notes (Signed)
Inpatient Diabetes Program Recommendations  AACE/ADA: New Consensus Statement on Inpatient Glycemic Control (2013)  Target Ranges:  Prepandial:   less than 140 mg/dL      Peak postprandial:   less than 180 mg/dL (1-2 hours)      Critically ill patients:  140 - 180 mg/dL   Results for [name], [name] (MRN [id number]) as of 09/20/2013 08:51  Ref. Range 09/19/2013 04:07 09/19/2013 07:48 09/19/2013 11:48 09/19/2013 16:59 09/19/2013 20:01 09/20/2013 04:06 09/20/2013 07:34  Glucose-Capillary Latest Range: 70-99 mg/dL 409259 (H) 811223 (H) 914312 (H) 297 (H) 338 (H) 289 (H) 250 (H)   Diabetes history: DM2 Outpatient Diabetes medications: Metformin 1000 mg BID Current orders for Inpatient glycemic control: Novolog 0-15 units Q4H  Inpatient Diabetes Program Recommendations Insulin - Basal: Please consider ordering Lantus 20 units QHS. Insulin - Meal Coverage: Please consider ordering Novolog 4 units TID with meals if patient eats at least 50% of meals. Insulin-Correction: Please consider changing frequency of CBGs and Novolog correction to ACHS if patient is eating and tolerating diet.  Thanks, Orlando PennerMarie Terron Merfeld, RN, MSN, CCRN Diabetes Coordinator Inpatient Diabetes Program [phone] (Team Pager) [phone] (AP office) [phone] [facility] office)

## 2013-09-20 NOTE — Progress Notes (Signed)
ANTICOAGULATION CONSULT NOTE - follow up  Pharmacy Consult for Heparin Indication: chest pain/ACS  Allergies  Allergen Reactions  . Codeine     REACTION-Unknown   Patient Measurements: Height: 6' (182.9 cm) Weight: 203 lb 14.8 oz (92.5 kg) IBW/kg (Calculated) : 77.6 Heparin Dosing Weight: 84.5Kg  Vital Signs: Temp: 98 F (36.7 C) (04/27 0736) Temp src: Oral (04/27 0736) BP: 111/53 mmHg (04/27 0400)  Labs:  Recent Labs  09/17/13 2139  09/18/13 0526  09/18/13 1124 09/18/13 1630 09/19/13 0504 09/19/13 1218 09/20/13 0430  HGB 13.9  --  12.9*  --   --   --  12.8*  --  12.1*  HCT 44.4  --  41.7  --   --   --  40.2  --  39.0  PLT 194  --  175  --   --   --  212  --  221  HEPARINUNFRC  --   --   --   < >  --  0.26* 0.29* 0.37 0.28*  CREATININE 1.31  --  0.98  --   --   --  0.63  --   --   TROPONINI 0.69*  < > 0.96*  --  0.66* 0.55*  --   --   --   < > = values in this interval not displayed. Estimated Creatinine Clearance: 88.9 ml/min (by C-G formula based on Cr of 0.63).  Medical History: Past Medical History  Diagnosis Date  . COPD (chronic obstructive pulmonary disease)   . Diabetes mellitus   . Spinal disease   . Myocardial infarct    Medications:  Scheduled:  . aspirin  325 mg Oral Daily  . benazepril  10 mg Oral q morning - 10a  . budesonide-formoterol  2 puff Inhalation BID  . diltiazem  180 mg Oral q morning - 10a  . docusate sodium  100 mg Oral BID  . insulin aspart  0-15 Units Subcutaneous 6 times per day  . ipratropium-albuterol  3 mL Nebulization Q6H  . levofloxacin (LEVAQUIN) IV  500 mg Intravenous Q24H  . methylPREDNISolone (SOLU-MEDROL) injection  60 mg Intravenous Q6H  . metoprolol  2.5 mg Intravenous 4 times per day  . sertraline  100 mg Oral q morning - 10a  . simvastatin  40 mg Oral QHS  . sodium chloride  3 mL Intravenous Q12H   Assessment: 73yo male admitted with AMS and SOB.  Troponin noted to be elevated initially.  Asked to initiate  Heparin for ACS.  Heparin level is slightly below target.    Goal of Therapy:  Heparin level 0.3-0.7 units/ml Monitor platelets by anticoagulation protocol: Yes   Plan:  Increase Heparin infusion to 1800 units/hr Heparin level in 6-8 hours then daily CBC daily while on heparin  Shiela Bruns A Keoni Havey 09/20/2013,7:45 AM

## 2013-09-20 NOTE — Progress Notes (Signed)
ANTICOAGULATION CONSULT NOTE - follow up  Pharmacy Consult for Heparin Indication: chest pain/ACS  Allergies  Allergen Reactions  . Codeine     REACTION-Unknown   Patient Measurements: Height: 6' (182.9 cm) Weight: 203 lb 14.8 oz (92.5 kg) IBW/kg (Calculated) : 77.6 Heparin Dosing Weight: 84.5Kg  Vital Signs: Temp: 98 F (36.7 C) (04/27 0736) Temp src: Oral (04/27 0736) BP: 120/63 mmHg (04/27 0800)  Labs:  Recent Labs  09/17/13 2139  09/18/13 0526  09/18/13 1124 09/18/13 1630 09/19/13 0504 09/19/13 1218 09/20/13 0430 09/20/13 1359  HGB 13.9  --  12.9*  --   --   --  12.8*  --  12.1*  --   HCT 44.4  --  41.7  --   --   --  40.2  --  39.0  --   PLT 194  --  175  --   --   --  212  --  221  --   HEPARINUNFRC  --   --   --   < >  --  0.26* 0.29* 0.37 0.28* 0.45  CREATININE 1.31  --  0.98  --   --   --  0.63  --   --   --   TROPONINI 0.69*  < > 0.96*  --  0.66* 0.55*  --   --   --   --   < > = values in this interval not displayed. Estimated Creatinine Clearance: 88.9 ml/min (by C-G formula based on Cr of 0.63).  Medical History: Past Medical History  Diagnosis Date  . COPD (chronic obstructive pulmonary disease)   . Diabetes mellitus   . Spinal disease   . Myocardial infarct    Medications:  Scheduled:  . aspirin  325 mg Oral Daily  . benazepril  10 mg Oral q morning - 10a  . budesonide-formoterol  2 puff Inhalation BID  . diltiazem  180 mg Oral q morning - 10a  . docusate sodium  100 mg Oral BID  . insulin aspart  0-15 Units Subcutaneous 6 times per day  . insulin glargine  20 Units Subcutaneous QHS  . ipratropium-albuterol  3 mL Nebulization Q6H  . levofloxacin (LEVAQUIN) IV  500 mg Intravenous Q24H  . methylPREDNISolone (SOLU-MEDROL) injection  60 mg Intravenous Q6H  . metoprolol  2.5 mg Intravenous 4 times per day  . sertraline  100 mg Oral q morning - 10a  . simvastatin  40 mg Oral QHS  . sodium chloride  3 mL Intravenous Q12H   Assessment: 73yo  male admitted with AMS and SOB.  Troponin noted to be elevated initially.  Asked to initiate Heparin for ACS.  Heparin level is therapeutic.    Goal of Therapy:  Heparin level 0.3-0.7 units/ml Monitor platelets by anticoagulation protocol: Yes   Plan:  Continue Heparin infusion at 1800 units/hr Heparin level daily CBC daily while on heparin  Liadan Guizar A Marchetta Navratil 09/20/2013,3:17 PM

## 2013-09-20 NOTE — Care Management Utilization Note (Signed)
UR completed 

## 2013-09-21 DIAGNOSIS — I5032 Chronic diastolic (congestive) heart failure: Secondary | ICD-10-CM

## 2013-09-21 DIAGNOSIS — E785 Hyperlipidemia, unspecified: Secondary | ICD-10-CM

## 2013-09-21 DIAGNOSIS — R0989 Other specified symptoms and signs involving the circulatory and respiratory systems: Secondary | ICD-10-CM

## 2013-09-21 DIAGNOSIS — R0609 Other forms of dyspnea: Secondary | ICD-10-CM

## 2013-09-21 DIAGNOSIS — I10 Essential (primary) hypertension: Secondary | ICD-10-CM

## 2013-09-21 DIAGNOSIS — G4730 Sleep apnea, unspecified: Secondary | ICD-10-CM

## 2013-09-21 DIAGNOSIS — Z87891 Personal history of nicotine dependence: Secondary | ICD-10-CM

## 2013-09-21 DIAGNOSIS — M4712 Other spondylosis with myelopathy, cervical region: Secondary | ICD-10-CM

## 2013-09-21 DIAGNOSIS — R4182 Altered mental status, unspecified: Secondary | ICD-10-CM

## 2013-09-21 DIAGNOSIS — R0902 Hypoxemia: Secondary | ICD-10-CM

## 2013-09-21 DIAGNOSIS — I2510 Atherosclerotic heart disease of native coronary artery without angina pectoris: Secondary | ICD-10-CM

## 2013-09-21 LAB — GLUCOSE, CAPILLARY
GLUCOSE-CAPILLARY: 239 mg/dL — AB (ref 70–99)
Glucose-Capillary: 168 mg/dL — ABNORMAL HIGH (ref 70–99)
Glucose-Capillary: 258 mg/dL — ABNORMAL HIGH (ref 70–99)
Glucose-Capillary: 262 mg/dL — ABNORMAL HIGH (ref 70–99)
Glucose-Capillary: 292 mg/dL — ABNORMAL HIGH (ref 70–99)
Glucose-Capillary: 315 mg/dL — ABNORMAL HIGH (ref 70–99)

## 2013-09-21 LAB — CBC
HCT: 42 % (ref 39.0–52.0)
Hemoglobin: 13.1 g/dL (ref 13.0–17.0)
MCH: 30.7 pg (ref 26.0–34.0)
MCHC: 31.2 g/dL (ref 30.0–36.0)
MCV: 98.4 fL (ref 78.0–100.0)
PLATELETS: 220 10*3/uL (ref 150–400)
RBC: 4.27 MIL/uL (ref 4.22–5.81)
RDW: 14.8 % (ref 11.5–15.5)
WBC: 8.9 10*3/uL (ref 4.0–10.5)

## 2013-09-21 LAB — HEPARIN LEVEL (UNFRACTIONATED): Heparin Unfractionated: 0.35 IU/mL (ref 0.30–0.70)

## 2013-09-21 MED ORDER — LEVOFLOXACIN 500 MG PO TABS
500.0000 mg | ORAL_TABLET | Freq: Every day | ORAL | Status: DC
  Administered 2013-09-21: 500 mg via ORAL
  Filled 2013-09-21: qty 1

## 2013-09-21 MED ORDER — INSULIN ASPART 100 UNIT/ML ~~LOC~~ SOLN
5.0000 [IU] | Freq: Three times a day (TID) | SUBCUTANEOUS | Status: DC
  Administered 2013-09-21 – 2013-09-22 (×3): 5 [IU] via SUBCUTANEOUS

## 2013-09-21 MED ORDER — INSULIN ASPART 100 UNIT/ML ~~LOC~~ SOLN
0.0000 [IU] | Freq: Every day | SUBCUTANEOUS | Status: DC
Start: 2013-09-21 — End: 2013-09-22
  Administered 2013-09-21: 4 [IU] via SUBCUTANEOUS

## 2013-09-21 MED ORDER — INSULIN GLARGINE 100 UNIT/ML ~~LOC~~ SOLN
23.0000 [IU] | Freq: Every day | SUBCUTANEOUS | Status: DC
  Administered 2013-09-21: 23 [IU] via SUBCUTANEOUS
  Filled 2013-09-21 (×2): qty 0.23

## 2013-09-21 MED ORDER — INSULIN ASPART 100 UNIT/ML ~~LOC~~ SOLN
0.0000 [IU] | Freq: Three times a day (TID) | SUBCUTANEOUS | Status: DC
  Administered 2013-09-21: 7 [IU] via SUBCUTANEOUS
  Administered 2013-09-22 (×2): 11 [IU] via SUBCUTANEOUS

## 2013-09-21 MED ORDER — IPRATROPIUM-ALBUTEROL 0.5-2.5 (3) MG/3ML IN SOLN
3.0000 mL | Freq: Three times a day (TID) | RESPIRATORY_TRACT | Status: DC
  Administered 2013-09-22 (×2): 3 mL via RESPIRATORY_TRACT
  Filled 2013-09-21 (×2): qty 3

## 2013-09-21 NOTE — Progress Notes (Signed)
Patient refuses to wear BIPAP at this time, [facility] toler[facility] well, lungs are clear to ascultation.

## 2013-09-21 NOTE — Care Management Note (Signed)
    Page 1 of 1   09/22/2013     4:39:35 PM CARE MANAGEMENT NOTE 09/22/2013  Patient:  [name],[name]   Account Number:  [id number]  Date Initiated:  09/20/2013  Documentation initiated by:  Anibal HendersonBOLDEN,Nivedita Mirabella  Subjective/Objective Assessment:   Pt admitted with COPD, AMS, resp failure, NSTEMI. Pt is from home with spouse. He is [name] total care pt and she has been caring for him for years, but he is  requiring even more care now.     Action/Plan:   Wife is requesting information on free  home assistance, if anything available. Given DSS for CAP program, but explained that he will be required to help pay for this with part of his SS check, the same as SNF.   Anticipated DC Date:  09/22/2013   Anticipated DC Plan:  HOME W HOME HEALTH SERVICES      DC Planning Services  CM consult      [facility]   Choice offered to / List presented to:  C-3 Spouse        HH arranged  HH-1 RN  HH-4 NURSE'S AIDE  HH-6 SOCIAL WORKER      HH agency  [facility]   Status of service:  Completed, signed off Medicare Important Message given?  YES (If response is "NO", the following Medicare IM given date fields will be blank) Date Medicare IM given:  09/22/2013 Date Additional Medicare IM given:    Discharge Disposition:  HOME W HOME HEALTH SERVICES  Per UR Regulation:  Reviewed for med. necessity/level of care/duration of stay  If discussed at Long Length of Stay Meetings, dates discussed:    Comments:  09/21/13 1700 Anibal HendersonGeneva Nelida Mandarino RN/CM Spouse was also given Veteran's [employer]'s number. Pt is [name] veteran and [place] [name]. [facility] to assist wife and pt   in return home, and [facility] to assist with  finding long term home care assistance. Spouse states she has all DME, including [name] bed and Hoyer lift

## 2013-09-21 NOTE — Consult Note (Signed)
The patient was seen and examined, and I agree with the assessment and plan as documented above, with modifications as noted below. Pt admitted on 4/25 with altered mental status and hypercapnic respiratory failure, deemed secondary to COPD exacerbation. He was placed on BiPAP, antibiotics, and IV steroids and gradually improved. He is on chronic O2 at home (3 liters per pt) and is a CO2 retainer. He also has a h/o tobacco abuse (since quit but office visit notes say last year, and pt says 5-10 yrs ago), CAD with angioplasty in either 1998 or 1999, hyperlipidemia, ankylosing spondylitis with resultant chronic pain, and diabetes mellitus. There has been no history of chest pain either at presentation, during hospitalization, or within the past several months. He also denies increasing dyspnea from baseline. He had been hypotensive on admission as well. Troponins were checked and found to be mildly elevated. Recent echo demonstrated normal LV systolic function, EF 55-60%, grade I diastolic dysfunction, mild RV systolic dysfunction, and very mild aortic stenosis.  I feel his troponin elevation is most likely representative of demand ischemia, secondary to both hypoxia and hypotension. ECG shows sinus rhythm with old inferior infarct, with no new findings. I recommend continuing current medical therapy with ASA, diltiazem, simvastatin, and benazepril. Would discontinue heparin and not check further troponins. Would d/c metoprolol as well, given severe COPD and potential for bronchospasm. He can f/u with his cardiologist, Dr. Wyline MoodBranch, and it can be determined at that time whether or not stress testing would be of any benefit.

## 2013-09-21 NOTE — Consult Note (Signed)
CARDIOLOGY CONSULT NOTE   Patient ID: [name] MRN: [id number] DOB/AGE: 75/06/1940 75 y.o.  Admit Date: 09/17/2013 Referring Physician: Renard MatterMcInnis MD Primary Physician: Alice ReichertMCINNIS,ANGUS G, MD Consulting Cardiologist: Prentice DockerKoneswaran, Suresh MD Primary Cardiologist : Dina RichBranch, Jonathan MD Reason for Consultation:   Clinical Summary [name] is a 73 y.o.male admitted with hypercapnic respiratory failure, COPD exacerbation and was found to have elevated troponin 0.29, with continued mild elevation with POC troponin of 0.69, and follow up Troponins of 0.37;0.28;0.45.0.35 respectively. He was seen in the ER in the setting of AMS, difficulty with speech and swallowing,, and was found to be hypotensive 89/49.         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 CXR was negative for pneumonia or CHF. EKG NSR with evidence of prior inferior infarct without evidence of ACS. UA was positive for UTI. He has been placed on heparin gtt. He denies any chest pain now or prior to admission.   \    He has a history of CAD with angioplasty of unspecified vessel, Hypercholesterolemia ,COPD, Diabetes,ankylosing spondylitis causing chronic pain and inability to ambulate on chronic pain meds for control. Documentation from admitting physician states that he has not required more pain control or has taken more doses of pain meds.        He complained of a few days of cough and productive sputum. States his wife told him he passed out. He continues to have mild confusion since being here. Pulling out IV's. He wears BiPAP at night.      Allergies  Allergen Reactions  . Codeine     REACTION-Unknown    Medications Scheduled Medications: . aspirin  325 mg Oral Daily  . benazepril  10 mg Oral q morning - 10a  . budesonide-formoterol  2 puff Inhalation BID  . diltiazem  180 mg Oral q morning - 10a  . docusate sodium  100 mg Oral BID  . insulin  aspart  0-15 Units Subcutaneous 6 times per day  . insulin glargine  20 Units Subcutaneous QHS  . ipratropium-albuterol  3 mL Nebulization Q6H  . levofloxacin (LEVAQUIN) IV  500 mg Intravenous Q24H  . methylPREDNISolone (SOLU-MEDROL) injection  60 mg Intravenous Q6H  . metoprolol  2.5 mg Intravenous 4 times per day  . sertraline  100 mg Oral q morning - 10a  . simvastatin  40 mg Oral QHS  . sodium chloride  3 mL Intravenous Q12H    Infusions: . heparin 1,800 Units/hr (09/21/13 0800)    PRN Medications: acetaminophen, acetaminophen, albuterol, ALPRAZolam, ondansetron (ZOFRAN) IV, ondansetron, oxyCODONE-acetaminophen   Past Medical History  Diagnosis Date  . COPD (chronic obstructive pulmonary disease)   . Diabetes mellitus   . Spinal disease   . Myocardial infarct     Past Surgical History  Procedure Laterality Date  . Back surgery    . Foreign body removal  04/23/2012    Procedure: FOREIGN BODY REMOVAL;  Surgeon: Corbin Adeobert M Rourk, MD;  Location: [facility];  Service: Endoscopy;  Laterality: N/A;    Family History  Problem Relation Age of Onset  . COPD Father     Social History [name] reports that he has quit smoking. He does not have any smokeless tobacco history on file. [name] reports that he does not drink alcohol.  Review of Systems Otherwise reviewed and negative except as outlined.  Physical Examination Blood pressure 99/59, pulse 80,  temperature 98 F (36.7 C), temperature source Oral, resp. rate 32, height 6' (1.829 m), weight 209 lb 7 oz (95 kg), SpO2 96.00%.  Intake/Output Summary (Last 24 hours) at 09/21/13 1009 Last data filed at 09/21/13 0800  Gross per 24 hour  Intake 1169.93 ml  Output   1075 ml  Net  94.93 ml    Telemetry:NSR rates in the 50's-60's.   GEN:Ill appearing, disheveled.  HEENT: Conjunctiva and lids normal, oropharynx clear with moist mucosa. Neck: Supple, no elevated JVP or carotid bruits, no thyromegaly. Lungs:  Clear to auscultation, nonlabored breathing at rest. Poor inspiratory effort. Cardiac: Regular rate and rhythm, no S3 or significant systolic murmur, no pericardial rub. Abdomen: Soft, nontender, no hepatomegaly, obese, bowel sounds present, no guarding or rebound. Extremities: No pitting edema, distal pulses 2+. Skin: Warm and dry. Musculoskeletal: No kyphosis. Neuropsychiatric: Alert and oriented x3, affect grossly appropriate.  Prior Cardiac Testing/Procedures 1.Echocardiogram 4.25/2015 Left ventricle: The cavity size was normal. Systolic function was normal. The estimated ejection fraction was in the range of 55% to 60%. Doppler parameters are consistent with abnormal left ventricular relaxation (grade 1 diastolic dysfunction). - Aortic valve: Transvalvular velocity was minimally increased. There was very mild stenosis. Valve area: 1.74cm^2 (Vmax). - Mitral valve: Mildly calcified annulus. Mild regurgitation. - Right ventricle: Systolic function was mildly reduced  Remote history of angioplasty of unknown artery.   Lab Results  Basic Metabolic Panel:  Recent Labs Lab 09/17/13 2139 09/18/13 0526 09/19/13 0504  NA 141 137 141  K 5.3 5.6* 4.1  CL 95* 93* 96  CO2 36* 30 32  GLUCOSE 165* 246* 269*  BUN 16 19 16   CREATININE 1.31 0.98 0.63  CALCIUM 9.3 8.8 9.6    Liver Function Tests:  Recent Labs Lab 09/17/13 2139 09/18/13 0526  AST 15 13  ALT 11 10  ALKPHOS 65 55  BILITOT 0.2* 0.2*  PROT 7.3 7.3  ALBUMIN 3.9 3.8    CBC:  Recent Labs Lab 09/17/13 2139 09/18/13 0526 09/19/13 0504 09/20/13 0430 09/21/13 0424  WBC 10.7* 8.8 8.7 8.6 8.9  NEUTROABS 8.8*  --   --   --   --   HGB 13.9 12.9* 12.8* 12.1* 13.1  HCT 44.4 41.7 40.2 39.0 42.0  MCV 99.6 99.8 96.6 98.0 98.4  PLT 194 175 212 221 220    Cardiac Enzymes:  Recent Labs Lab 09/17/13 2139 09/18/13 0006 09/18/13 0526 09/18/13 1124 09/18/13 1630  TROPONINI 0.69* 0.82* 0.96* 0.66* 0.55*      Radiology: 09/17/2013 FINDINGS: Upper normal heart size. Slight pulmonary vascular congestion. Atherosclerotic calcification aorta. Bronchitic changes without gross infiltrate, pleural effusion or pneumothorax. Superior mediastinum a slight prominence a unchanged, potentially related to slightly lordotic AP technique. No pneumothorax or acute osseous findings.  IMPRESSION: Bronchitic changes.  ECG: NSR evidence of inferior infarct.    Impression and Recommendations  1.NSTEMI Type II: Likely multifactorial with COPD exacerbation with hypoxia. He has known history of CAD with remote angioplasty to nonspecific coronary artery. EKG demonstrates prior inferior infarct with Q-waves noted. There is no significant elevation in cardiac markers. Can potentially have a lexiscan cardiolite for evaluation of progression of CAD, but doubt cause of his current status.     He is not on BB due to COPD, but is on diltiazem 180 mg daily for angina, remains on statin and ACE. Started on ASA 325 mg daily on this admission. Will stop heparin and stop cycling cardiac markers. Continue medical management for now.  2. COPD exacerbation: With associated hypoxia and AMS. He is now being treated with levaquin, with IV and inhaled steroids. BiPAP at HS.   3. UTI: Consider urine culture for better management options.  4. Diabetes: Not well controlled currently. May be from steroids. WBC are not significantly elevated, and are now WNL  5. Ankylosing spondylitis: Basically bedridden due to this and requiring a lot of pain control. Denies over use of narcotics.         Signed: Bettey MareKathryn M. Lyman BishopLawrence NP [facility] 09/21/2013, 10:09 AM Co-Sign MD

## 2013-09-21 NOTE — Progress Notes (Signed)
ANTICOAGULATION CONSULT NOTE  Pharmacy Consult for Heparin Indication: chest pain/ACS  Allergies  Allergen Reactions  . Codeine     REACTION-Unknown   Patient Measurements: Height: 6' (182.9 cm) Weight: 209 lb 7 oz (95 kg) IBW/kg (Calculated) : 77.6 Heparin Dosing Weight: 84.5Kg  Vital Signs: Temp: 98 F (36.7 C) (04/28 0800) Temp src: Oral (04/28 0800) BP: 99/59 mmHg (04/28 0800)  Labs:  Recent Labs  09/18/13 1124  09/18/13 1630  09/19/13 0504  09/20/13 0430 09/20/13 1359 09/21/13 0424  HGB  --   --   --   < > 12.8*  --  12.1*  --  13.1  HCT  --   --   --   --  40.2  --  39.0  --  42.0  PLT  --   --   --   --  212  --  221  --  220  HEPARINUNFRC  --   < > 0.26*  --  0.29*  < > 0.28* 0.45 0.35  CREATININE  --   --   --   --  0.63  --   --   --   --   TROPONINI 0.66*  --  0.55*  --   --   --   --   --   --   < > = values in this interval not displayed. Estimated Creatinine Clearance: 96.9 ml/min (by C-G formula based on Cr of 0.63).  Medical History: Past Medical History  Diagnosis Date  . COPD (chronic obstructive pulmonary disease)   . Diabetes mellitus   . Spinal disease   . Myocardial infarct    Medications:  Scheduled:  . aspirin  325 mg Oral Daily  . benazepril  10 mg Oral q morning - 10a  . budesonide-formoterol  2 puff Inhalation BID  . diltiazem  180 mg Oral q morning - 10a  . docusate sodium  100 mg Oral BID  . insulin aspart  0-15 Units Subcutaneous 6 times per day  . insulin glargine  20 Units Subcutaneous QHS  . ipratropium-albuterol  3 mL Nebulization Q6H  . levofloxacin (LEVAQUIN) IV  500 mg Intravenous Q24H  . methylPREDNISolone (SOLU-MEDROL) injection  60 mg Intravenous Q6H  . metoprolol  2.5 mg Intravenous 4 times per day  . sertraline  100 mg Oral q morning - 10a  . simvastatin  40 mg Oral QHS  . sodium chloride  3 mL Intravenous Q12H   Assessment: 73yo male admitted with AMS and SOB.  Troponin noted to be elevated initially.  Asked  to initiate Heparin for ACS.  Heparin level is therapeutic.   No bleeding noted. Cardiology consult pending.   Goal of Therapy:  Heparin level 0.3-0.7 units/ml Monitor platelets by anticoagulation protocol: Yes   Plan:  Continue Heparin infusion at 1800 units/hr Heparin level & CBC daily while on heparin *Patient has completed >48rs of IV heparin for medical management of NSTEMI.  Consider d/c IV heparin unless intervention recommended by cardiology.   Mercy Ridingndrea Michelle Raja Caputi 09/21/2013,9:08 AM

## 2013-09-21 NOTE — Progress Notes (Signed)
PHARMACIST - PHYSICIAN COMMUNICATION DR:   Renard MatterMcInnis CONCERNING: Antibiotic IV to Oral Route Change Policy  RECOMMENDATION: This patient is receiving Levaquin by the intravenous route.  Based on criteria approved by the Pharmacy and Therapeutics Committee, the antibiotic(s) is/are being converted to the equivalent oral dose form(s).   DESCRIPTION: These criteria include:  Patient being treated for a respiratory tract infection, urinary tract infection, cellulitis or clostridium difficile associated diarrhea if on metronidazole  The patient is not neutropenic and does not exhibit a GI malabsorption state  The patient is eating (either orally or via tube) and/or has been taking other orally administered medications for a least 24 hours  The patient is improving clinically and has a Tmax < 100.5  If you have questions about this conversion, please contact the Pharmacy Department  [x]   [phone]( [phone] )  [facility] []   [phone]( [phone] )  [facility]  []   [phone]  [facility] []   [phone]( [phone]  [facility]   Junita PushMichelle Coleman Kalas, PharmD, BCPS 09/21/2013@1 :50 PM

## 2013-09-21 NOTE — Progress Notes (Signed)
Inpatient Diabetes Program Recommendations  AACE/ADA: New Consensus Statement on Inpatient Glycemic Control (2013)  Target Ranges:  Prepandial:   less than 140 mg/dL      Peak postprandial:   less than 180 mg/dL (1-2 hours)      Critically ill patients:  140 - 180 mg/dL   Results for Bufford SpikesUCKETT, Coulson A (MRN [id number]) as of 09/21/2013 13:56  Ref. Range 09/20/2013 07:34 09/20/2013 11:30 09/20/2013 16:46 09/20/2013 19:58 09/21/2013 00:02 09/21/2013 04:11 09/21/2013 07:51 09/21/2013 11:40  Glucose-Capillary Latest Range: 70-99 mg/dL 295250 (H) 284277 (H) 132284 (H) 296 (H) 292 (H) 262 (H) 168 (H) 258 (H)   Diabetes history: DM2 Outpatient Diabetes medications: Metformin 1000 mg BID  Current orders for Inpatient glycemic control: Lantus 20 units QHS, Novolog 0-15 units Q4H  Inpatient Diabetes Program Recommendations Insulin - Basal: Please consider increasing Lantus to 23 units QHS. Correction (SSI): Please consider increasing Novolog correction to resistant scale and change frequency to ACHS. Insulin - Meal Coverage: Please consider ordering Novolog 6 units TID with meals for meal coverage.  Thanks, Orlando PennerMarie Machel Violante, RN, MSN, CCRN Diabetes Coordinator Inpatient Diabetes Program [phone] (Team Pager) [phone] ([facility] office) [phone] [facility] office)

## 2013-09-21 NOTE — Progress Notes (Signed)
Subjective: The patient had a fairly comfortable night he uses BiPAP at night and oxygen by nasal catheter during the day. He's not experienced any chest pain.  Objective: Vital signs in last 24 hours: Temp:  [97 F (36.1 C)-98.1 F (36.7 C)] 97 F (36.1 C) (04/28 0400) Resp:  [13-24] 19 (04/28 0600) BP: (110-120)/(48-63) 110/48 mmHg (04/28 0400) SpO2:  [88 %-100 %] 100 % (04/28 0600) FiO2 (%):  [40 %] 40 % (04/28 0202) Weight:  [95 kg (209 lb 7 oz)] 95 kg (209 lb 7 oz) (04/28 0500) Weight change: 2.5 kg (5 lb 8.2 oz) Last BM Date: 09/20/13  Intake/Output from previous day: 04/27 0701 - 04/28 0700 In: 1153.9 [P.O.:120; I.V.:1033.9] Out: 1075 [Urine:1075] Intake/Output this shift: Total I/O In: 1033.9 [I.V.:1033.9] Out: 600 [Urine:600]  Physical Exam: The patient is alert and oriented  HEENT negative  Neck supple no JVD or thyroid abnormalities  Heart regular rhythm no murmurs  Lungs diminished breath sounds with rhonchi bilaterally  Abdomen the palpable organs or masses  Neurological patient is weakness in extremities   Recent Labs  09/20/13 0430 09/21/13 0424  WBC 8.6 8.9  HGB 12.1* 13.1  HCT 39.0 42.0  PLT 221 220   BMET  Recent Labs  09/19/13 0504  NA 141  K 4.1  CL 96  CO2 32  GLUCOSE 269*  BUN 16  CREATININE 0.63  CALCIUM 9.6    Studies/Results: No results found.  Medications:  . aspirin  325 mg Oral Daily  . benazepril  10 mg Oral q morning - 10a  . budesonide-formoterol  2 puff Inhalation BID  . diltiazem  180 mg Oral q morning - 10a  . docusate sodium  100 mg Oral BID  . insulin aspart  0-15 Units Subcutaneous 6 times per day  . insulin glargine  20 Units Subcutaneous QHS  . ipratropium-albuterol  3 mL Nebulization Q6H  . levofloxacin (LEVAQUIN) IV  500 mg Intravenous Q24H  . methylPREDNISolone (SOLU-MEDROL) injection  60 mg Intravenous Q6H  . metoprolol  2.5 mg Intravenous 4 times per day  . sertraline  100 mg Oral q morning -  10a  . simvastatin  40 mg Oral QHS  . sodium chloride  3 mL Intravenous Q12H    . heparin 1,800 Units/hr (09/21/13 0600)     Assessment/Plan: . Acute respiratory failure COPD plan to continue current IV antibiotics Levaquin and Solu-Medrol and neb treatments 2. Diabetes mellitus continue short-acting insulin diabetic diet and monitor blood sugars  3 coronary artery disease with recent peak of troponin possible non-ST MI plan to continue heparin drip and obtain cardiology consult today  LOS: 4 days   [name] G [name] 09/21/2013, 6:48 AM

## 2013-09-22 LAB — GLUCOSE, CAPILLARY
Glucose-Capillary: 247 mg/dL — ABNORMAL HIGH (ref 70–99)
Glucose-Capillary: 283 mg/dL — ABNORMAL HIGH (ref 70–99)

## 2013-09-22 LAB — URINE CULTURE

## 2013-09-22 MED ORDER — INSULIN PEN STARTER KIT
1.0000 | Freq: Once | Status: DC
  Filled 2013-09-22: qty 1

## 2013-09-22 MED ORDER — LEVOFLOXACIN 500 MG PO TABS: 500.0000 mg | ORAL_TABLET | Freq: Every day | ORAL | Status: AC

## 2013-09-22 NOTE — Progress Notes (Signed)
D/c instructions reviewed with patient and wife.  Verbalized understanding. Pt dc'd to home with wife via EMS. Candelaria StagersLeigh Anne Schonewitz 09/22/2013

## 2013-09-22 NOTE — Progress Notes (Signed)
Nutrition Brief Note  RD pulled to chart due to LOS  Wt Readings from Last 15 Encounters:  09/22/13 211 lb 10.3 oz (96 kg)  04/23/12 200 lb (90.719 kg)  04/23/12 200 lb (90.719 kg)  04/23/12 200 lb (90.719 kg)  01/07/12 215 lb (97.523 kg)  07/03/11 205 lb (92.987 kg)  06/06/11 197 lb 1.5 oz (89.4 kg)  07/24/10 205 lb (92.987 kg)  10/27/09 205 lb (92.987 kg)  11/10/08 210 lb (95.255 kg)    Body mass index is 28.7 kg/(m^2). Patient meets criteria for overweight based on current BMI.   Current diet order is carb modified, patient is consuming approximately 75-100% of meals at this time. Labs and medications reviewed.   No nutrition interventions warranted at this time. If nutrition issues arise, please consult RD.   Wilbern Pennypacker A. Mayford KnifeWilliams, RD, LDN Pager: [phone]

## 2013-09-22 NOTE — Progress Notes (Signed)
Inpatient Diabetes Program Recommendations  AACE/ADA: New Consensus Statement on Inpatient Glycemic Control (2013)  Target Ranges:  Prepandial:   less than 140 mg/dL      Peak postprandial:   less than 180 mg/dL (1-2 hours)      Critically ill patients:  140 - 180 mg/dL  Results for [name], [name] (MRN [id number]) as of 09/22/2013 08:20  Ref. Range 09/21/2013 07:51 09/21/2013 11:40 09/21/2013 16:18 09/21/2013 21:53 09/22/2013 07:27  Glucose-Capillary Latest Range: 70-99 mg/dL 409168 (H) 811258 (H) 914239 (H) 315 (H) 247 (H)   Diabetes history: DM2  Outpatient Diabetes medications: Metformin 1000 mg BID  Current orders for Inpatient glycemic control: Lantus 23 units QHS, Novolog 0-20 units AC, Novolog 0-5 units HS, Novolog 5 units TID with meals  Inpatient Diabetes Program Recommendations Insulin - Basal: Please increase Lantus to 25 units QHS. Insulin - Meal Coverage: Please increase Novolog meal coverage to 8 units TID with meals.  Thanks, Orlando PennerMarie Glen Blatchley, RN, MSN, CCRN Diabetes Coordinator Inpatient Diabetes Program [phone] (Team Pager) [phone] ([facility] office) [phone] [facility] office)

## 2013-09-22 NOTE — Discharge Summary (Signed)
Physician Discharge Summary  [name] SpikesJoseph A Lupercio NGE:[id number] DOB: 09/30/1940 DOA: 09/17/2013  PCP: Alice ReichertMCINNIS,Cahterine Heinzel G, MD  Admit date: 09/17/2013 Discharge date: 09/22/2013     Discharge Diagnoses:  1. 1. Altered mental status 2. Acute respiratory failure with hypercapnia hypoxia 3. Coronary artery disease with possible non-ST MI 4. Diabetes mellitus type 2 5. Ankylosing spondylitis with chronic back pain  Discharge Condition: Condition stable Disposition: Patient will be sent home by EMS Diet recommendation: 2000-calorie ADA low-sodium diet  Filed Weights   09/20/13 0500 09/21/13 0500 09/22/13 0500  Weight: 92.5 kg (203 lb 14.8 oz) 95 kg (209 lb 7 oz) 96 kg (211 lb 10.3 oz)    History of present illness:  The patient has a past history of ankylosing spondylitis, diabetes, COPD, coronary artery disease on medical management . He was brought to the emergency department by EMS and was evaluated and was felt to have respiratory failure with hypercapnia is placed on BiPAP and admitted to the step down unit ICU examination on admission lungs diminished breath sounds heart regular rhythm abdomen the palpable organs or masses extremities free of edema bilateral weakness in extremities Hospital Course:  The patient was intermittently confused mentally during her stay in ICU. This seemed to be related to episodes of hypoxia and he improved with BiPAP. His O2 sats were down some initially but stabilized with O2 sat 94-97%. The patient was started on IV Levaquin Solu-Medrol and nebulizer treatments and remained on this route the entire hospitalization there was some concern because his troponins peaked range from 0.37. 4.5 and then 0.35. 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 Patient had prior history of coronary artery disease . He was seen by cardiology felt that the elevation of troponin was called by demand ischemia and hypoxia. The patient was on heparin  drip throughout this period of time this was  discontinued. No other recommendations at this time except followup as outpatient. The patient was on nasal oxygen 2-3 L at times his discharge. His diabetes was controlled with Lantus and short-acting insulin. The patient is to continue the following medications Discharge Instruction the patient will be sent home by EMS, he would be on the following medications, he will be on 23 units daily of Levemir insulin.   Future Appointments Provider Department Dept Phone   10/14/2013 1:40 PM Antoine PocheJonathan F Branch, MD [facility] [place] [phone]       Medication List         albuterol 108 (90 BASE) MCG/ACT inhaler  Commonly known as:  PROVENTIL HFA;VENTOLIN HFA  Inhale 2 puffs into the lungs every 6 (six) hours as needed. For rescue-shortness of breath     ALPRAZolam 0.5 MG tablet  Commonly known as:  XANAX  Take 0.5 mg by mouth 3 (three) times daily as needed. anxiety     BENADRYL ITCH STOPPING 2 % Gel  Generic drug:  DIPHENHYDRAMINE HCL (TOPICAL)  Apply 1 application topically daily as needed.     benazepril 10 MG tablet  Commonly known as:  LOTENSIN  Take 10 mg by mouth every morning.     budesonide-formoterol 160-4.5 MCG/ACT inhaler  Commonly known as:  SYMBICORT  Inhale 2 puffs into the lungs 2 (two) times daily as needed.     diltiazem 180 MG 24 hr capsule  Commonly known as:  DILACOR XR  Take 180 mg by mouth every morning.     gabapentin 100 MG capsule  Commonly known as:  NEURONTIN  Take 100 mg by mouth 3 (three) times daily.     levofloxacin 500 MG tablet  Commonly known as:  LEVAQUIN  Take 1 tablet (500 mg total) by mouth at bedtime.     metFORMIN 1000 MG tablet  Commonly known as:  GLUCOPHAGE  Take 1 tablet (1,000 mg total) by mouth 2 (two) times daily with a meal.     nystatin cream  Commonly known as:  MYCOSTATIN  Apply 1 application topically daily as needed for dry skin.     oxyCODONE-acetaminophen 7.5-325 MG  per tablet  Commonly known as:  PERCOCET  Take 1 tablet by mouth 3 (three) times daily as needed for pain.     sertraline 100 MG tablet  Commonly known as:  ZOLOFT  Take 100 mg by mouth every morning.     simvastatin 40 MG tablet  Commonly known as:  ZOCOR  Take 40 mg by mouth at bedtime.     tiotropium 18 MCG inhalation capsule  Commonly known as:  SPIRIVA  Place 18 mcg into inhaler and inhale daily as needed (for shortness of breath).     traMADol 50 MG tablet  Commonly known as:  ULTRAM  Take 100 mg by mouth 2 (two) times daily as needed. pain       Allergies  Allergen Reactions  . Codeine     REACTION-Unknown    The results of significant diagnostics from this hospitalization (including imaging, microbiology, ancillary and laboratory) are listed below for reference.    Significant Diagnostic Studies: Ct Head Wo Contrast  09/18/2013   CLINICAL DATA:  Altered mental status. Confusion and difficulty with speech and swallowing. Diffuse weakness.  EXAM: CT HEAD WITHOUT CONTRAST  TECHNIQUE: Contiguous axial images were obtained from the base of the skull through the vertex without intravenous contrast.  COMPARISON:  CT of the head performed 06/01/2011  FINDINGS: There is no evidence of acute infarction, mass lesion, or intra- or extra-axial hemorrhage on CT.  Periventricular and subcortical white matter change likely reflects small vessel ischemic microangiopathy. 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  The posterior fossa, including the cerebellum, brainstem and fourth ventricle, is within normal limits. The third and lateral ventricles, and basal ganglia are unremarkable in appearance. The No mass effect or midline shift is seen.  There is no evidence of fracture; visualized osseous structures are unremarkable in appearance. The orbits are within normal limits. The paranasal sinuses and mastoid air cells are well-aerated. No  significant soft tissue abnormalities are seen.  IMPRESSION: 1. No evidence of acute intracranial pathology on CT. 2. 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   Electronically Signed   By: Roanna RaiderJeffery  Chang M.D.   On: 09/18/2013 00:19   Dg Chest Portable 1 View  09/17/2013   CLINICAL DATA:  Weakness, shortness of breath, altered mental status, abnormal labs, history diabetes, COPD, MI  EXAM: PORTABLE CHEST - 1 VIEW  COMPARISON:  Portable exam 2145 hr compared to 04/23/2012  FINDINGS: Upper normal heart size.  Slight pulmonary vascular congestion.  Atherosclerotic calcification aorta.  Bronchitic changes without gross infiltrate, pleural effusion or pneumothorax.  Superior mediastinum a slight prominence a unchanged, potentially related to slightly lordotic AP technique.  No pneumothorax or acute osseous findings.  IMPRESSION: Bronchitic changes.   Electronically Signed   By: Ulyses SouthwardMark  Boles M.D.   On: 09/17/2013 21:55    Microbiology:  Recent Results (from the past 240 hour(s))  MRSA PCR SCREENING     Status: None   Collection Time    09/18/13  1:48 AM      Result Value Ref Range Status   MRSA by PCR NEGATIVE  NEGATIVE Final   Comment:            The GeneXpert MRSA Assay (FDA     approved for NASAL specimens     only), is one component of a     comprehensive MRSA colonization     surveillance program. It is not     intended to diagnose MRSA     infection nor to guide or     monitor treatment for     MRSA infections.     Labs: Basic Metabolic Panel:  Recent Labs Lab 09/17/13 2139 09/18/13 0526 09/19/13 0504  NA 141 137 141  K 5.3 5.6* 4.1  CL 95* 93* 96  CO2 36* 30 32  GLUCOSE 165* 246* 269*  BUN 16 19 16   CREATININE 1.31 0.98 0.63  CALCIUM 9.3 8.8 9.6   Liver Function Tests:  Recent Labs Lab 09/17/13 2139 09/18/13 0526  AST 15 13  ALT 11 10  ALKPHOS 65 55  BILITOT 0.2* 0.2*  PROT 7.3 7.3  ALBUMIN  3.9 3.8   No results found for this basename: LIPASE, AMYLASE,  in the last 168 hours No results found for this basename: AMMONIA,  in the last 168 hours CBC:  Recent Labs Lab 09/17/13 2139 09/18/13 0526 09/19/13 0504 09/20/13 0430 09/21/13 0424  WBC 10.7* 8.8 8.7 8.6 8.9  NEUTROABS 8.8*  --   --   --   --   HGB 13.9 12.9* 12.8* 12.1* 13.1  HCT 44.4 41.7 40.2 39.0 42.0  MCV 99.6 99.8 96.6 98.0 98.4  PLT 194 175 212 221 220   Cardiac Enzymes:  Recent Labs Lab 09/17/13 2139 09/18/13 0006 09/18/13 0526 09/18/13 1124 09/18/13 1630  TROPONINI 0.69* 0.82* 0.96* 0.66* 0.55*   BNP: BNP (last 3 results) No results found for this basename: PROBNP,  in the last 8760 hours CBG:  Recent Labs Lab 09/21/13 1140 09/21/13 1618 09/21/13 2153 09/22/13 0727 09/22/13 1127  GLUCAP 258* 239* 315* 247* 283*    Principal Problem:   Respiratory failure, acute Active Problems:   DIABETES MELLITUS, TYPE II   COPD   SLEEP APNEA   Hypercapnemia   NSTEMI (non-ST elevated myocardial infarction)   Time coordinating discharge: 45 minutes Signed:  Butch Penny, MD 09/22/2013, 2:39 PM

## 2013-10-14 ENCOUNTER — Ambulatory Visit: Payer: Medicare Other | Admitting: Cardiology

## 2013-11-22 ENCOUNTER — Encounter: Payer: Self-pay | Admitting: Cardiology

## 2013-11-22 ENCOUNTER — Ambulatory Visit (INDEPENDENT_AMBULATORY_CARE_PROVIDER_SITE_OTHER): Payer: Medicare Other | Admitting: Cardiology

## 2013-11-22 VITALS — BP 122/68 | HR 78 | Ht 71.0 in | Wt 210.0 lb

## 2013-11-22 DIAGNOSIS — I2510 Atherosclerotic heart disease of native coronary artery without angina pectoris: Secondary | ICD-10-CM

## 2013-11-22 NOTE — Patient Instructions (Signed)
Your physician wants you to follow-up in: 1 year You will receive a reminder letter in the mail two months in advance. If you don't receive a letter, please call our office to schedule the follow-up appointment.    Your physician recommends that you continue on your current medications as directed. Please refer to the Current Medication list given to you today.     Thank you for choosing  [facility] !  

## 2013-11-22 NOTE — Progress Notes (Signed)
Clinical Summary [name] is a 73 y.o.male seen today for follow up of the following medical problems.  1. CAD  - [facility], records unclear. No history of PCI  - denies any chest chest pain. Gradual worsening of SOB over last 6-7 months, chronically sleeps in hospital bed, has not had to increase incline. Denies any LE edema  - sedentary lifestyle b/c of ankylosing spondylisits/chronic back pain, does not exert himself  - compliant w/ meds  - recent admit 08/2013 with COPD exacerbation and hypercapneic resp failure and hypotension, peak trop 0.69. Echo with normal LVEF 55-60%,  - denies any chest pain.   2. Severe COPD  -home O2, chronic CO2 retainer. Compliant w/ home O2, mixed compliance w/ inhalers due to cost  - reports chronic significant SOB/DOE that is fairly stable  3.HL: - reports he was called recently by one of his doctors to stop his simvastatin due to a potential for interaction with another medication. He is not sure who initiated the call, and he was not started on another statin.        Past Medical History  Diagnosis Date  . COPD (chronic obstructive pulmonary disease)   . Diabetes mellitus   . Spinal disease   . Myocardial infarct      Allergies  Allergen Reactions  . Codeine     REACTION-Unknown     Current Outpatient Prescriptions  Medication Sig Dispense Refill  . albuterol (PROVENTIL HFA;VENTOLIN HFA) 108 (90 BASE) MCG/ACT inhaler Inhale 2 puffs into the lungs every 6 (six) hours as needed. For rescue-shortness of breath      . ALPRAZolam (XANAX) 0.5 MG tablet Take 0.5 mg by mouth 3 (three) times daily as needed. anxiety       . benazepril (LOTENSIN) 10 MG tablet Take 10 mg by mouth every morning.      . budesonide-formoterol (SYMBICORT) 160-4.5 MCG/ACT inhaler Inhale 2 puffs into the lungs 2 (two) times daily as needed.      . diltiazem (DILACOR XR) 180 MG 24 hr capsule Take 180 mg by mouth every morning.         Marland Kitchen. DIPHENHYDRAMINE HCL, TOPICAL, (BENADRYL ITCH STOPPING) 2 % GEL Apply 1 application topically daily as needed.      . gabapentin (NEURONTIN) 100 MG capsule Take 100 mg by mouth 3 (three) times daily.       Marland Kitchen. levofloxacin (LEVAQUIN) 500 MG tablet Take 1 tablet (500 mg total) by mouth at bedtime.  7 tablet  1  . metFORMIN (GLUCOPHAGE) 1000 MG tablet Take 1 tablet (1,000 mg total) by mouth 2 (two) times daily with a meal.  90 tablet  3  . nystatin cream (MYCOSTATIN) Apply 1 application topically daily as needed for dry skin.      Marland Kitchen. oxyCODONE-acetaminophen (PERCOCET) 7.5-325 MG per tablet Take 1 tablet by mouth 3 (three) times daily as needed for pain.      Marland Kitchen. sertraline (ZOLOFT) 100 MG tablet Take 100 mg by mouth every morning.       . simvastatin (ZOCOR) 40 MG tablet Take 40 mg by mouth at bedtime.      Marland Kitchen. tiotropium (SPIRIVA) 18 MCG inhalation capsule Place 18 mcg into inhaler and inhale daily as needed (for shortness of breath).       . traMADol (ULTRAM) 50 MG tablet Take 100 mg by mouth 2 (two) times daily as needed. pain  No current facility-administered medications for this visit.     Past Surgical History  Procedure Laterality Date  . Back surgery    . Foreign body removal  04/23/2012    Procedure: FOREIGN BODY REMOVAL;  Surgeon: Corbin Adeobert M Rourk, MD;  Location: [facility];  Service: Endoscopy;  Laterality: N/A;     Allergies  Allergen Reactions  . Codeine     REACTION-Unknown      Family History  Problem Relation Age of Onset  . COPD Father      Social History [name] reports that he has quit smoking. He does not have any smokeless tobacco history on file. [name] reports that he does not drink alcohol.   Review of Systems CONSTITUTIONAL: No weight loss, fever, chills, weakness or fatigue.  HEENT: Eyes: No visual loss, blurred vision, double vision or yellow sclerae.No hearing loss, sneezing, congestion, runny nose or sore throat.  SKIN: No rash or  itching.  CARDIOVASCULAR: per HPI RESPIRATORY: chronic SOB GASTROINTESTINAL: No anorexia, nausea, vomiting or diarrhea. No abdominal pain or blood.  GENITOURINARY: No burning on urination, no polyuria NEUROLOGICAL: No headache, dizziness, syncope, paralysis, ataxia, numbness or tingling in the extremities. No change in bowel or bladder control.  MUSCULOSKELETAL: + diffuse joint pain  LYMPHATICS: No enlarged nodes. No history of splenectomy.  PSYCHIATRIC: No history of depression or anxiety.  ENDOCRINOLOGIC: No reports of sweating, cold or heat intolerance. No polyuria or polydipsia.  Marland Kitchen.   Physical Examination p 78 bp 122/68 Wt 210 lbs BMI 29 Gen: resting comfortably, no acute distress HEENT: no scleral icterus, pupils equal round and reactive, no palptable cervical adenopathy,  CV: RRR, no m/r/g, no JVD Resp: Clear to auscultation bilaterally GI: abdomen is soft, non-tender, non-distended, normal bowel sounds, no hepatosplenomegaly MSK: extremities are warm, no edema.  Skin: warm, no rash Neuro:  no focal deficits Psych: appropriate affect   Diagnostic Studies Jan 2013 Echo: LVEF 50-55%, akinesis basal inferior wall,   08/2013 Echo Study Conclusions  - Left ventricle: The cavity size was normal. Systolic function was normal. The estimated ejection fraction was in the range of 55% to 60%. Doppler parameters are consistent with abnormal left ventricular relaxation (grade 1 diastolic dysfunction). - Aortic valve: Transvalvular velocity was minimally increased. There was very mild stenosis. Valve area: 1.74cm^2 (Vmax). - Mitral valve: Mildly calcified annulus. Mild regurgitation. - Right ventricle: Systolic function was mildly reduced.     Assessment and Plan   1. CAD: no current symptoms. Continue risk factor modification and secondary prevention.  - recent mild troponin leak in the setting of COPD exacerbation, hypercapneic resp failure and hypotension. Echo with normal  LV function, no WMAs - continue medical therapy. The patient has severe advanced COPD and ankylosing spondylitis (extremely limited and painful movement of all extremities), non-invasive testing or invasive testing would be extremely difficult and currently is not strongly indicated.   2. HL - managed by pcp, it is not clear why statin was stopped. Will defer to pcp.   3. Sever COPD  - per pcp   F/u 1 year   Antoine PocheJonathan F. Branch, M.D., F.A.C.C.

## 2014-01-25 DEATH — deceased

## 2015-03-09 IMAGING — CT CT HEAD W/O CM
1 of 2 series · 13 of 30 positions shown, 17 images · non-contrast
Comparison: CT of the head performed 06/01/2011

CLINICAL DATA: Altered mental status. Confusion and difficulty with
speech and swallowing. Diffuse weakness.

EXAM:
CT HEAD WITHOUT CONTRAST
TECHNIQUE: Contiguous axial images were obtained from the base of the skull
through the vertex without intravenous contrast.

[Series 2: headseq 4.8 h37s · axial · 0.49mm/px · z∈[+1157,+1307]mm · 13 of 36 slices shown, 17 images]
[im 3/36  brain]
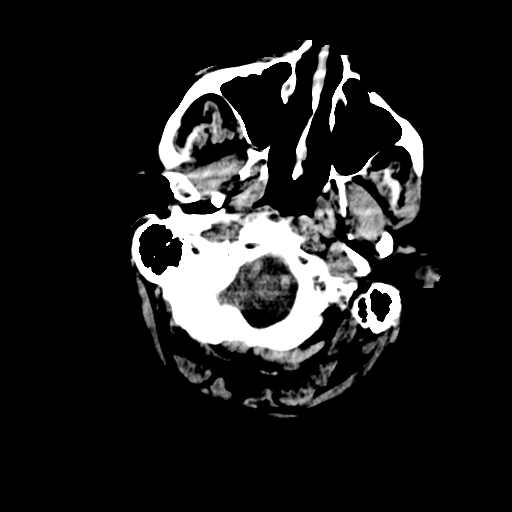
[im 3/36  bone]
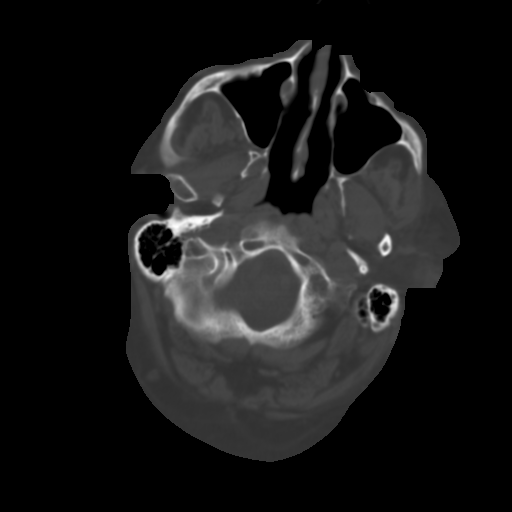
[im 6/36  brain]
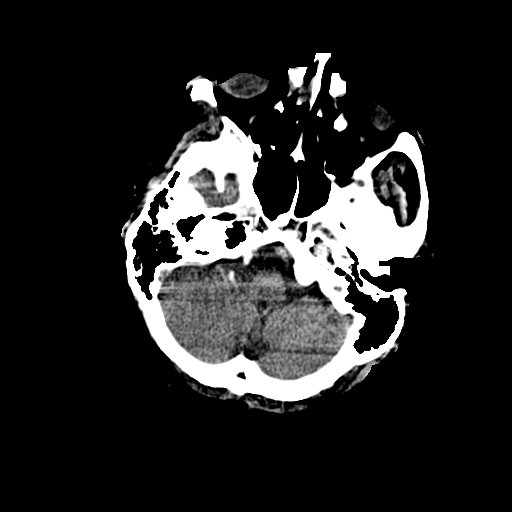
[im 8/36  brain]
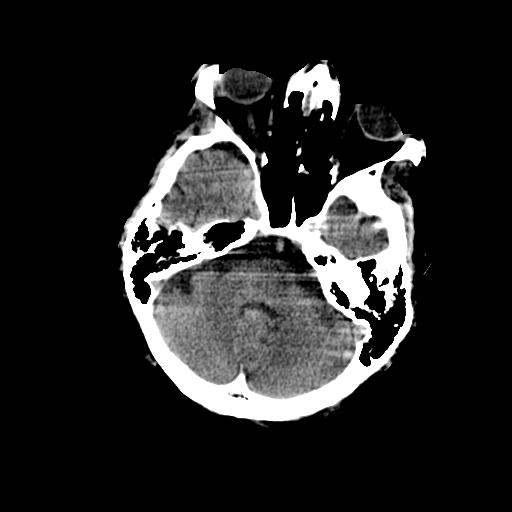
[im 11/36  brain]
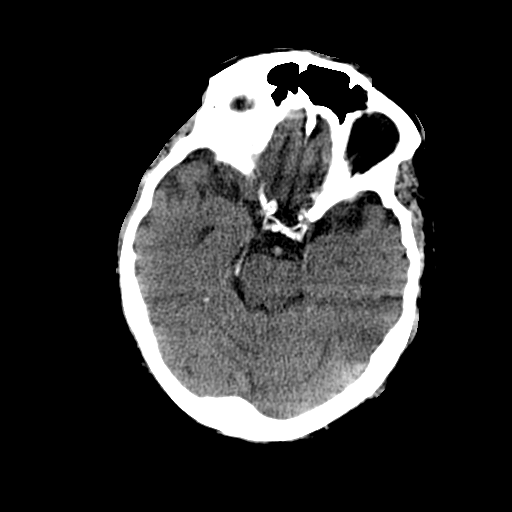
[im 13/36  brain]
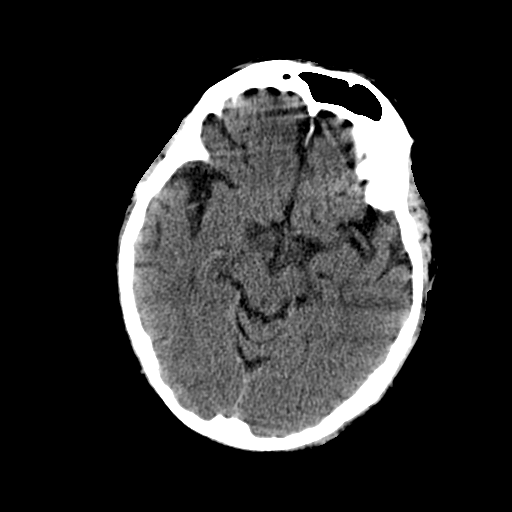
[im 13/36  bone]
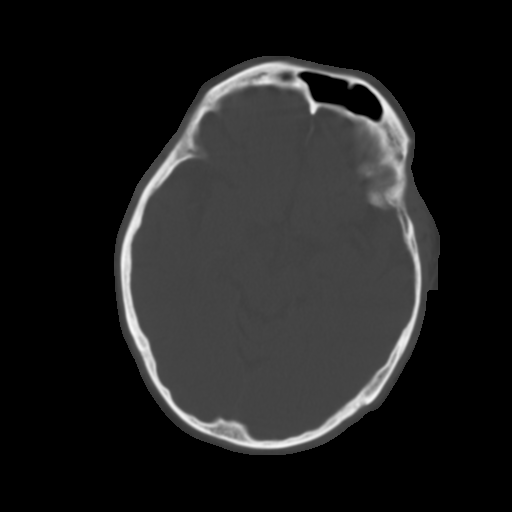
[im 16/36  brain]
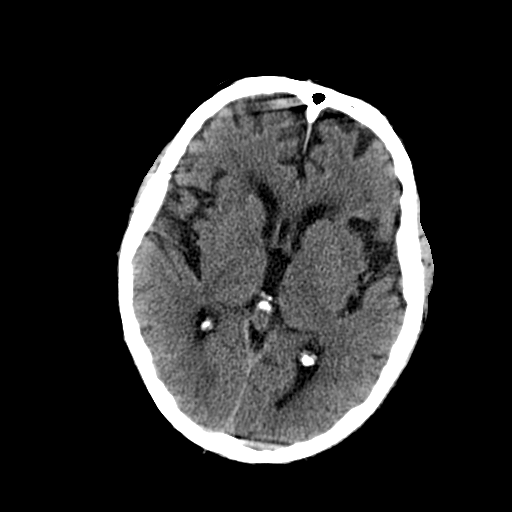
[im 18/36  brain]
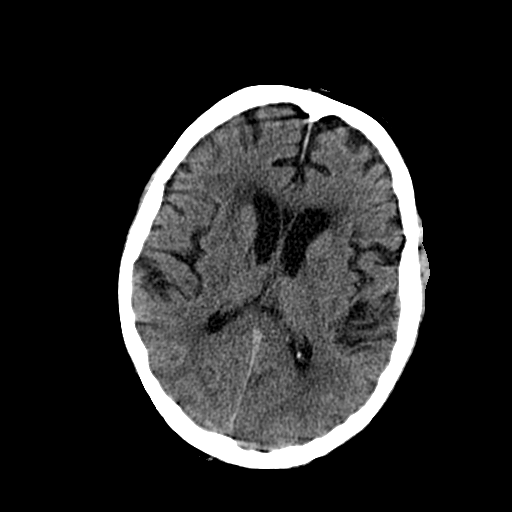
[im 21/36  brain]
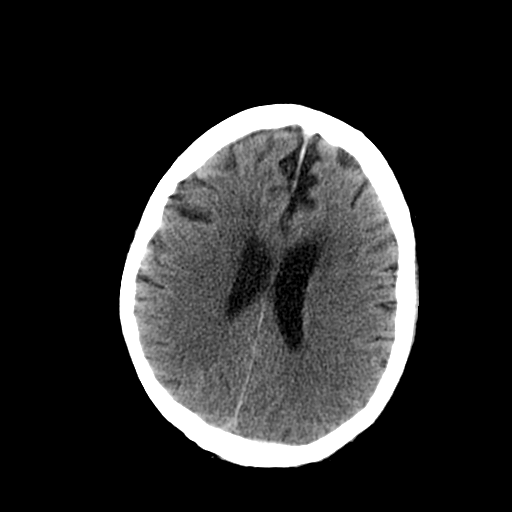
[im 23/36  brain]
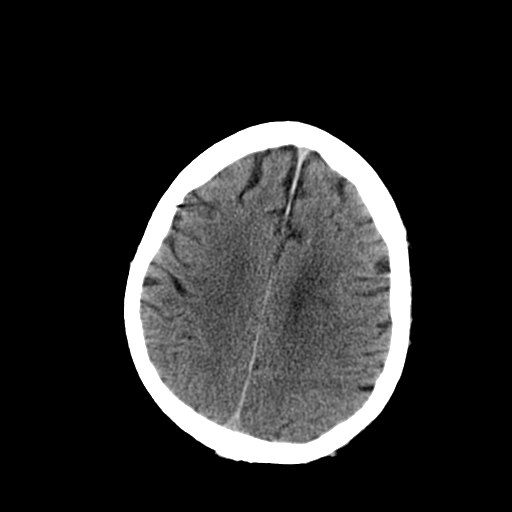
[im 23/36  bone]
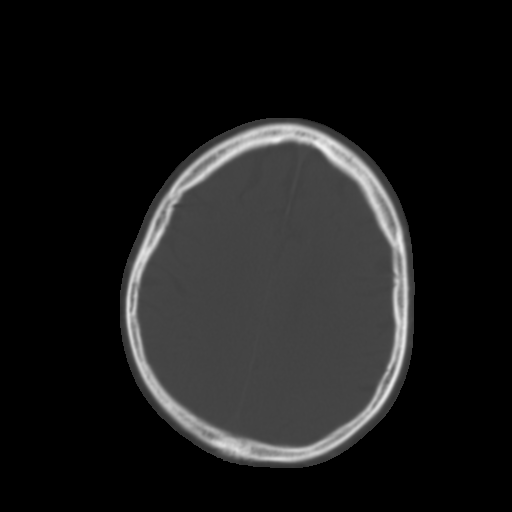
[im 26/36  brain]
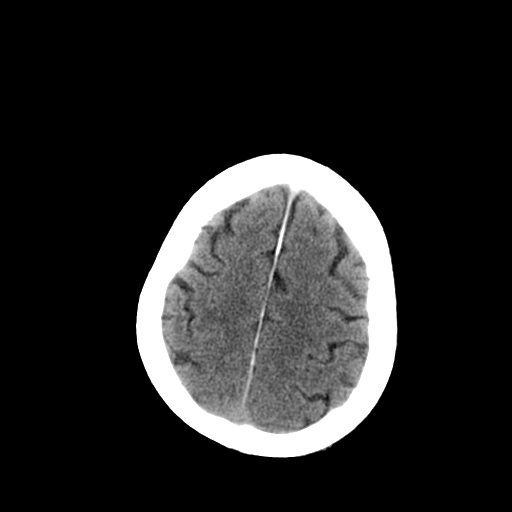
[im 28/36  brain]
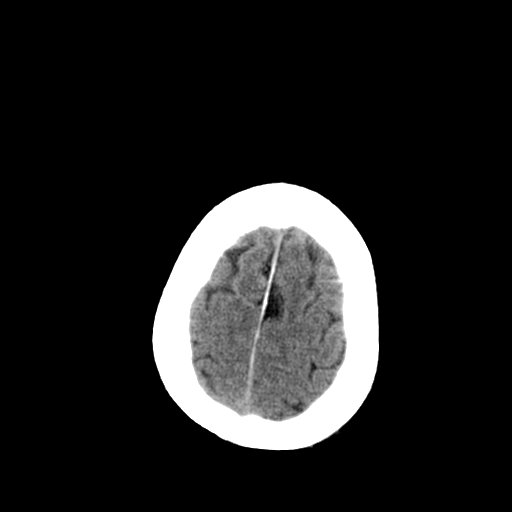
[im 31/36  brain]
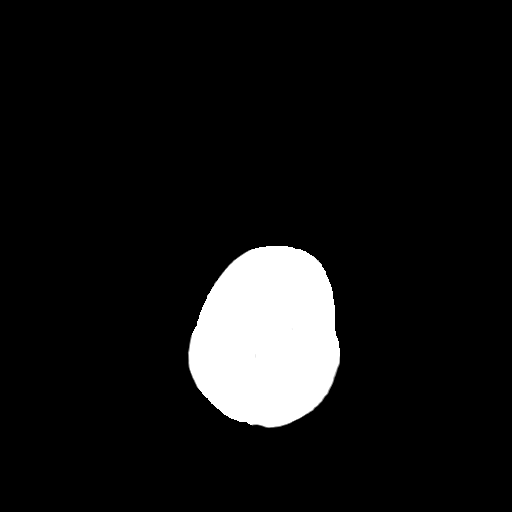
[im 33/36  brain]
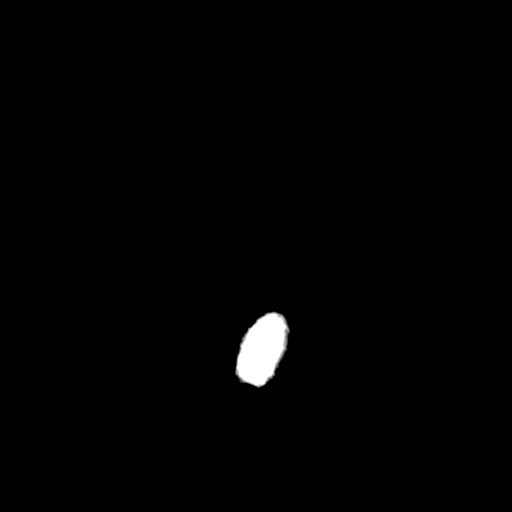
[im 33/36  bone]
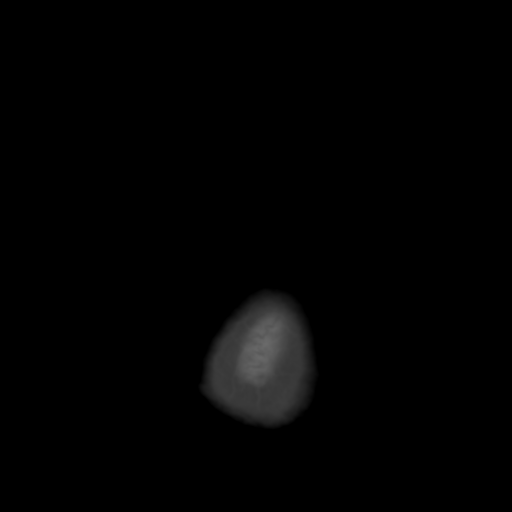

[13 of 30 positions shown; findings below may reference images not displayed]

FINDINGS: There is no evidence of acute infarction, mass lesion, or intra- or
extra-axial hemorrhage on CT.

Periventricular and subcortical white matter change likely reflects
small vessel ischemic microangiopathy. There may be a small chronic
infarct at the medial left frontal lobe, unchanged from 8325.

The posterior fossa, including the cerebellum, brainstem and fourth
ventricle, is within normal limits. The third and lateral
ventricles, and basal ganglia are unremarkable in appearance. The No
mass effect or midline shift is seen.

There is no evidence of fracture; visualized osseous structures are
unremarkable in appearance. The orbits are within normal limits. The
paranasal sinuses and mastoid air cells are well-aerated. No
significant soft tissue abnormalities are seen.
IMPRESSION: 1. No evidence of acute intracranial pathology on CT.
2. Scattered small vessel ischemic microangiopathy; stable likely
small chronic infarct at the medial left frontal lobe.
